# Patient Record
Sex: Female | Born: 1935 | Race: White | Hispanic: No | Smoking: Former smoker
Health system: Southern US, Community
[De-identification: ages and names within clinical notes are randomized; demographics above are authoritative.]

## PROBLEM LIST (undated history)

## (undated) DIAGNOSIS — E039 Hypothyroidism, unspecified: Secondary | ICD-10-CM

## (undated) DIAGNOSIS — K219 Gastro-esophageal reflux disease without esophagitis: Secondary | ICD-10-CM

## (undated) DIAGNOSIS — R55 Syncope and collapse: Secondary | ICD-10-CM

## (undated) DIAGNOSIS — M069 Rheumatoid arthritis, unspecified: Secondary | ICD-10-CM

## (undated) DIAGNOSIS — G901 Familial dysautonomia [Riley-Day]: Secondary | ICD-10-CM

## (undated) HISTORY — DX: Hypothyroidism, unspecified: E03.9

## (undated) HISTORY — PX: PACEMAKER PLACEMENT: SHX43

## (undated) HISTORY — PX: CHOLECYSTECTOMY: SHX55

## (undated) HISTORY — DX: Familial dysautonomia (riley-day): G90.1

## (undated) HISTORY — DX: Gastro-esophageal reflux disease without esophagitis: K21.9

## (undated) HISTORY — DX: Rheumatoid arthritis, unspecified: M06.9

## (undated) HISTORY — PX: OTHER SURGICAL HISTORY: SHX169

## (undated) HISTORY — DX: Syncope and collapse: R55

---

## 2005-04-03 ENCOUNTER — Ambulatory Visit: Payer: Self-pay

## 2005-05-07 ENCOUNTER — Ambulatory Visit: Payer: Self-pay

## 2005-05-07 ENCOUNTER — Encounter: Payer: Self-pay | Admitting: Cardiology

## 2005-05-07 ENCOUNTER — Ambulatory Visit: Payer: Self-pay | Admitting: Internal Medicine

## 2005-09-11 ENCOUNTER — Ambulatory Visit: Payer: Self-pay | Admitting: Internal Medicine

## 2005-09-27 ENCOUNTER — Ambulatory Visit: Payer: Self-pay | Admitting: Internal Medicine

## 2005-09-27 ENCOUNTER — Ambulatory Visit (HOSPITAL_COMMUNITY): Admission: RE | Admit: 2005-09-27 | Discharge: 2005-09-27 | Payer: Self-pay | Admitting: Internal Medicine

## 2005-10-09 ENCOUNTER — Ambulatory Visit: Payer: Self-pay

## 2005-10-11 ENCOUNTER — Ambulatory Visit: Payer: Self-pay | Admitting: Cardiovascular Disease

## 2005-12-17 ENCOUNTER — Ambulatory Visit: Payer: Self-pay | Admitting: Internal Medicine

## 2006-01-30 ENCOUNTER — Ambulatory Visit: Payer: Self-pay | Admitting: Internal Medicine

## 2006-04-07 ENCOUNTER — Ambulatory Visit: Payer: Self-pay | Admitting: Internal Medicine

## 2006-04-18 ENCOUNTER — Ambulatory Visit: Payer: Self-pay | Admitting: Internal Medicine

## 2006-08-19 ENCOUNTER — Ambulatory Visit: Payer: Self-pay | Admitting: Internal Medicine

## 2006-08-19 LAB — CONVERTED CEMR LAB
Basophils Relative: 0.9 % (ref 0.0–1.0)
CO2: 31 meq/L (ref 19–32)
Calcium: 9.1 mg/dL (ref 8.4–10.5)
Creatinine, Ser: 0.8 mg/dL (ref 0.4–1.2)
Eosinophils Relative: 1.4 % (ref 0.0–5.0)
GFR calc Af Amer: 89 mL/min
Glucose, Bld: 106 mg/dL — ABNORMAL HIGH (ref 70–99)
Hemoglobin: 13.2 g/dL (ref 12.0–15.0)
Lymphocytes Relative: 26.9 % (ref 12.0–46.0)
Monocytes Absolute: 0.5 10*3/uL (ref 0.2–0.7)
Neutro Abs: 5.4 10*3/uL (ref 1.4–7.7)
Potassium: 4.4 meq/L (ref 3.5–5.1)
Prothrombin Time: 11.4 s (ref 10.0–14.0)
WBC: 8.4 10*3/uL (ref 4.5–10.5)

## 2006-08-22 ENCOUNTER — Ambulatory Visit (HOSPITAL_COMMUNITY): Admission: RE | Admit: 2006-08-22 | Discharge: 2006-08-23 | Payer: Self-pay | Admitting: Internal Medicine

## 2006-08-22 ENCOUNTER — Ambulatory Visit: Payer: Self-pay | Admitting: Internal Medicine

## 2006-09-10 ENCOUNTER — Ambulatory Visit: Payer: Self-pay

## 2006-10-06 ENCOUNTER — Ambulatory Visit: Payer: Self-pay

## 2006-12-24 ENCOUNTER — Ambulatory Visit: Payer: Self-pay | Admitting: Internal Medicine

## 2007-05-27 ENCOUNTER — Ambulatory Visit: Payer: Self-pay | Admitting: Cardiology

## 2007-05-29 ENCOUNTER — Ambulatory Visit: Payer: Self-pay

## 2007-06-10 ENCOUNTER — Ambulatory Visit: Payer: Self-pay

## 2007-09-15 ENCOUNTER — Ambulatory Visit: Payer: Self-pay | Admitting: Internal Medicine

## 2007-10-13 DIAGNOSIS — Z950 Presence of cardiac pacemaker: Secondary | ICD-10-CM | POA: Insufficient documentation

## 2007-10-13 DIAGNOSIS — R55 Syncope and collapse: Secondary | ICD-10-CM | POA: Insufficient documentation

## 2007-12-16 ENCOUNTER — Ambulatory Visit: Payer: Self-pay | Admitting: Internal Medicine

## 2008-03-16 ENCOUNTER — Ambulatory Visit: Payer: Self-pay | Admitting: Internal Medicine

## 2008-04-15 ENCOUNTER — Encounter (INDEPENDENT_AMBULATORY_CARE_PROVIDER_SITE_OTHER): Payer: Self-pay | Admitting: Radiology

## 2008-06-28 ENCOUNTER — Ambulatory Visit: Payer: Self-pay | Admitting: Internal Medicine

## 2008-09-27 ENCOUNTER — Encounter: Payer: Self-pay | Admitting: Internal Medicine

## 2008-09-28 ENCOUNTER — Ambulatory Visit: Payer: Self-pay | Admitting: Internal Medicine

## 2008-10-05 ENCOUNTER — Encounter: Payer: Self-pay | Admitting: Internal Medicine

## 2008-12-28 ENCOUNTER — Ambulatory Visit: Payer: Self-pay | Admitting: Internal Medicine

## 2009-01-10 ENCOUNTER — Encounter: Payer: Self-pay | Admitting: Internal Medicine

## 2009-03-29 ENCOUNTER — Ambulatory Visit: Payer: Self-pay | Admitting: Internal Medicine

## 2009-04-04 ENCOUNTER — Encounter: Payer: Self-pay | Admitting: Internal Medicine

## 2009-07-04 ENCOUNTER — Ambulatory Visit: Payer: Self-pay | Admitting: Internal Medicine

## 2009-07-04 DIAGNOSIS — R42 Dizziness and giddiness: Secondary | ICD-10-CM

## 2009-10-05 ENCOUNTER — Ambulatory Visit: Payer: Self-pay | Admitting: Internal Medicine

## 2009-11-03 ENCOUNTER — Encounter (INDEPENDENT_AMBULATORY_CARE_PROVIDER_SITE_OTHER): Payer: Self-pay | Admitting: *Deleted

## 2009-11-22 ENCOUNTER — Encounter: Payer: Self-pay | Admitting: Internal Medicine

## 2009-12-25 ENCOUNTER — Telehealth: Payer: Self-pay | Admitting: Internal Medicine

## 2010-01-11 ENCOUNTER — Encounter: Payer: Self-pay | Admitting: Internal Medicine

## 2010-01-11 ENCOUNTER — Ambulatory Visit
Admission: RE | Admit: 2010-01-11 | Discharge: 2010-01-11 | Payer: Self-pay | Source: Home / Self Care | Attending: Internal Medicine | Admitting: Internal Medicine

## 2010-02-06 NOTE — Cardiovascular Report (Signed)
Summary: Office Visit Remote   Office Visit Remote   Imported By: Roderic Ovens 11/24/2009 14:06:26  _____________________________________________________________________  External Attachment:    Type:   Image     Comment:   External Document

## 2010-02-06 NOTE — Letter (Signed)
Summary: Remote Device Check  [facility], Main Office  [address]   [place], [place] [address]   Phone: [phone]  Fax: [phone]     November 22, 2009 MRN: [id number]   STEPFANIE YOTT [address] [place], [place]  [address]   Dear Ms. Washinton,   Your remote transmission was recieved and reviewed by your physician.  All diagnostics were within normal limits for you.  __X___Your next transmission is scheduled for:   01-11-2010.  Please transmit at any time this day.  If you have a wireless device your transmission will be sent automatically.   Sincerely,  Vella Kohler

## 2010-02-06 NOTE — Assessment & Plan Note (Signed)
Summary: device/medtronic/saf   CC:  medtronic.  Marland Kitchen  History of Present Illness: Rita [name] comes in with her daughter in followup for syncope in the context of intermittent complete heart block which on further evaluation may have been autonomic inferred on the basis of the lack of peaking on her device    She has had no recurrent syncope. She does have orthostatic lightheadedness. She does not know that she gets dizzy getting out of a shower or out of the tub   Current Medications (verified): 1)  Vitamin E 600 Unit  Caps (Vitamin E) .... Take One Tablet By Mouth Once Daily. 2)  Vitamin B-12 500 Mcg  Tabs (Cyanocobalamin) .... Take One Tablet By Mouth Once Daily. 3)  Fish Oil   Oil (Fish Oil) .... Take One Capsule Once Daily 4)  Multivitamins   Tabs (Multiple Vitamin) 5)  Prednisone 5 Mg Tabs (Prednisone) .... 2 Tabs Once Daily 6)  Methotrexate 2.5 Mg Tabs (Methotrexate Sodium) .Marland Kitchen.. 10 Tablets Every Tuesday 7)  Prednisone 10 Mg Tabs (Prednisone) .... Take 1/2 Tablet Every Day 8)  Osteo Bi-Flex Adv Double St  Caps (Misc Natural Products) .... Uad  Allergies (verified): 1)  ! Sulfa  Past History:  Past Surgical History: Last updated: 10/13/2007 pacemaker loop recorder Cholecystectomy  Social History: Last updated: 10/13/2007 Widowed  3 children Tobacco Use - No.  Alcohol Use - no Drug Use - no  Past Medical History: Syncope Dysautonomia Pacemaker-AV (S/P) Hypothyroidism G E R D RA  Vital Signs:  Patient profile:   77 year old female Height:      63 inches Weight:      168 pounds BMI:     29.87 Pulse rate:   99 / minute Pulse rhythm:   regular BP sitting:   108 / 68  (left arm) Cuff size:   large  Vitals Entered By: Judithe Modest CMA (July 04, 2009 9:23 AM)  Physical Exam  General:  The patient was alert and oriented in no acute distress. HEENT Normal.  Neck veins were flat, carotids were brisk.  Lungs were clear.  Heart sounds were regular without  murmurs or gallops.  Abdomen was soft with active bowel sounds. There is no clubbing cyanosis or edema. Skin Warm and dry    PPM Specifications Following MD:  Sherryl Manges, MD     PPM Vendor:  Medtronic     PPM Model Number:  [id number]     PPM Serial Number:  [id number] PPM DOI:  08/22/2006     PPM Implanting MD:  Sherryl Manges, MD  Lead 1    Location: RA     DOI: 08/22/2006     Model #: [id number]     Serial #: [id number]     Status: active Lead 2    Location: RV     DOI: 08/22/2006     Model #: [id number]     Serial #: [id number]     Status: active   Indications:  SYNCOPE,CHB   PPM Follow Up Battery Voltage:  2.77 V     Battery Est. Longevity:  23yrs     Pacer Dependent:  No       PPM Device Measurements Atrium  Amplitude: 4.00 mV, Impedance: 492 ohms, Threshold: 0.750 V at 0.40 msec Right Ventricle  Amplitude: 15.68 mV, Impedance: 705 ohms, Threshold: 0.50 V at 0.40 msec  Episodes MS Episodes:  2     Percent Mode Switch:  <0.1%     Ventricular High  Rate:  4     Atrial Pacing:  <0.1%     Ventricular Pacing:  0.2%  Parameters Mode:  MVP     Lower Rate Limit:  60     Upper Rate Limit:  130 Paced AV Delay:  190     Sensed AV Delay:  160 Next Remote Date:  10/05/2009     Tech Comments:  43 RATE DROP EPISODES.  4 VHR EPISODES AND 2 AHR EPISODES--SVT 1:1.  CHANGED RA OUTPUT FROM 1.5 TO 2.0 AND RV OUTPUT FROM 2.0 TO 2.5.  CARELINK CHECK 10-05-09. Vella Kohler  July 04, 2009 9:37 AM  Impression & Recommendations:  Problem # 1:  PACEMAKER-DDD  MDT (ICD-V45.Marland Kitchen01) Device parameters and data were reviewed and no changes were made  Problem # 2:  ORTHOSTATIC DIZZINESS (ICD-780.4) orthostatic lightheadedness with moderate evidence of orthostatic hypotension with an increase of her heart rate from 90-106 and decrease in her blood pressure 100-93. She has moderate hypotension. We'll check her cortisol level and advise a variety of anti-orthostatic behaviors including raising the head of the bed and  isometric exercise.  discussion with the patient and her daughter suggested like to try medication also given frequency of the symptoms. We'll start ProAmatine 2.5  3 times a day  Problem # 3:  AV BLOCK, COMPLETE LIKELY DYSAUTONOMIC (ICD-426.0) no intercurrent episode  Patient Instructions: 1)   You are scheduled for a device check from home on October 05, 2009. You may send your transmission at any time that day. If you have a wireless device, the transmission will be sent automatically. After your physician reviews your transmission, you will receive a postcard with your next transmission date. 2)  Your physician wants you to follow-up in: 12 months with Dr Graciela Husbands.  You will receive a reminder letter in the mail two months in advance. If you don't receive a letter, please call our office to schedule the follow-up appointment. 3)  Your physician recommends that you return for lab work in: 2 months (cortisol level) 4)  Your physician has recommended you make the following change in your medication: Start Midodrine 2.5mg  1 tablet three times a day at 7AM, 11Am, 3PM Prescriptions: MIDODRINE HCL 2.5 MG TABS (MIDODRINE HCL) Take 1 tablet by mouth three times daily at 7AM, 11AM, and 3PM  #90 x 3   Entered by:   Optometrist BSN   Authorized by:   Nathen May, MD, [facility]   Signed by:   Gypsy Balsam RN BSN on 07/04/2009   Method used:   Electronically to        [facility]. [phone]* (retail)       [address]       [place], [place]  [address]       Ph: [phone] or [phone]       Fax: [phone]   RxID:   [id number]

## 2010-02-06 NOTE — Cardiovascular Report (Signed)
Summary: Office Visit Remote   Office Visit Remote   Imported By: Roderic Ovens 01/12/2009 12:40:33  _____________________________________________________________________  External Attachment:    Type:   Image     Comment:   External Document

## 2010-02-06 NOTE — Cardiovascular Report (Signed)
Summary: Office Visit Remote   Office Visit Remote   Imported By: Roderic Ovens 04/06/2009 12:00:45  _____________________________________________________________________  External Attachment:    Type:   Image     Comment:   External Document

## 2010-02-06 NOTE — Miscellaneous (Signed)
Summary: dx correction  Clinical Lists Changes  Problems: Changed problem from PACEMAKER DDD MDT (ICD-V45..01) to PACEMAKER, PERMANENT (ICD-V45.01)  changed the incorrect dx code to correct dx code 

## 2010-02-06 NOTE — Cardiovascular Report (Signed)
Summary: Office Visit   Office Visit   Imported By: Roderic Ovens 07/05/2009 12:29:00  _____________________________________________________________________  External Attachment:    Type:   Image     Comment:   External Document

## 2010-02-06 NOTE — Letter (Signed)
Summary: Remote Device Check  [facility], Main Office  [address]   [place], [place] [address]   Phone: [phone]  Fax: [phone]     January 10, 2009 MRN: [id number]   Rita [name] [address] [place], [place]  [address]   Dear Ms. Solano,   Your remote transmission was recieved and reviewed by your physician.  All diagnostics were within normal limits for you.  __X___Your next transmission is scheduled for:   March 29, 2009.  Please transmit at any time this day.  If you have a wireless device your transmission will be sent automatically.     Sincerely,  Proofreader

## 2010-02-06 NOTE — Letter (Signed)
Summary: Remote Device Check  [facility], Main Office  [address]   [place], [place] [address]   Phone: [phone]  Fax: [phone]     April 04, 2009 MRN: [id number]   MIKALIA FESSEL [address] [place], [place]  [address]   Dear Ms. Reiling,   Your remote transmission was recieved and reviewed by your physician.  All diagnostics were within normal limits for you.    ___X___Your next office visit is scheduled for:    JUNE 2011 WITH DR Graciela Husbands. Please call our office to schedule an appointment.    Sincerely,  Proofreader

## 2010-02-08 NOTE — Progress Notes (Signed)
Summary: pt having sob, weakness, and blurred vision  Phone Note Call from Patient   Caller: Daughter beverly garner [phone] Reason for Call: Talk to Nurse Summary of Call: pt having sob, blurred vision, and weakness for about a week-pls call to advise Initial call taken by: Glynda Jaeger,  December 25, 2009 8:17 AM  Follow-up for Phone Call        per pt daughter pt feels like she will pass out when getting up, yesterday she barely got out of bed, [facility] and feels faint, vision is blurred, when breathes she holds chest and has a cough-no sputum, very weak. adv daughter will discuss with dod.  Follow-up by: Claris Gladden RN,  December 25, 2009 8:54 AM  Additional Follow-up for Phone Call Additional follow up Details #1::        spoke w/PA and discussed symptoms and he recommed pt be seen in ED. Adv pt daughter and pt afraid she will end up in hospital. Adv daughter that pt really needs checked and that PA concerned about PNA. She will also have  a transmission sent today. Daughter has not checked pt temp. Additional Follow-up by: Claris Gladden RN,  December 25, 2009 9:12 AM

## 2010-02-18 ENCOUNTER — Encounter (INDEPENDENT_AMBULATORY_CARE_PROVIDER_SITE_OTHER): Payer: Self-pay | Admitting: *Deleted

## 2010-02-28 NOTE — Cardiovascular Report (Signed)
Summary: Office Visit Remote   Office Visit Remote   Imported By: Roderic Ovens 02/20/2010 15:19:04  _____________________________________________________________________  External Attachment:    Type:   Image     Comment:   External Document

## 2010-02-28 NOTE — Letter (Signed)
Summary: Remote Device Check  [facility], Main Office  [address]   [place], [place] [address]   Phone: [phone]  Fax: [phone]     February 18, 2010 MRN: [id number]   Rita [name] [address] [place], [place]  [address]   Dear Ms. Stuckey,   Your remote transmission was recieved and reviewed by your physician.  All diagnostics were within normal limits for you.  __X___Your next transmission is scheduled for:  04-12-2010.  Please transmit at any time this day.  If you have a wireless device your transmission will be sent automatically.   Sincerely,  Vella Kohler

## 2010-03-20 ENCOUNTER — Encounter: Payer: Self-pay | Admitting: Emergency Medicine

## 2010-03-20 ENCOUNTER — Encounter (INDEPENDENT_AMBULATORY_CARE_PROVIDER_SITE_OTHER): Payer: Medicare Other

## 2010-03-20 ENCOUNTER — Institutional Professional Consult (permissible substitution) (INDEPENDENT_AMBULATORY_CARE_PROVIDER_SITE_OTHER): Payer: Medicare Other | Admitting: Emergency Medicine

## 2010-03-20 DIAGNOSIS — R0602 Shortness of breath: Secondary | ICD-10-CM

## 2010-03-20 DIAGNOSIS — R599 Enlarged lymph nodes, unspecified: Secondary | ICD-10-CM | POA: Insufficient documentation

## 2010-03-20 LAB — PULMONARY FUNCTION TEST

## 2010-03-27 NOTE — Assessment & Plan Note (Signed)
Summary: pft charges   Allergies: 1)  ! Sulfa   Other Orders: Carbon Monoxide diffusing w/capacity (29562) Lung Volumes/Gas dilution or washout (13086) Spirometry (Pre & Post) [phone])

## 2010-04-05 NOTE — Assessment & Plan Note (Signed)
Summary: dyspnea, hilar LAD   Visit Type:  Initial Consult Copy to:  Dr. Luna Kitchens Primary Provider/Referring Provider:  Dr. Luna Kitchens  CC:  Pulmonary consult.  Increased SOB w/ exertion and at rest..  History of Present Illness: 77 yo former light smoker (1.5pk-yrs), followed by Dr Welton Flakes. Hx of Heart Block s/p pacer placement, symptomatic hypotension formerly on midodrine. Has been seen in [facility] ED at [facility] on several occasions for acute dyspnea, ? panic attacks. She began to have these problems Fall 2011. She complains now of difficulty getting a deep breath - she does sighing breaths in the office today. No wheeze, no cough, no CP. The sighing breaths are her largest complaint, and have been the most striking abnormality seen by family. Her exertional tolerance may be decreased as well.   Eval:  ABG (02/04/10) 7.47/ 27/ 104/ 20 on RA CT scan chest (02/04/10 [name]) - No PE, no parenchymal dz, borderline R hilar and pretracheal nodes, 1.7cm subcarinal node.   Preventive Screening-Counseling & Management  Alcohol-Tobacco     Alcohol drinks/day: 0     Smoking Status: quit     Packs/Day: 0.5     Year Started: 1957     Year Quit: 1960  Current Medications (verified): 1)  Methotrexate 2.5 Mg Tabs (Methotrexate Sodium) .Marland Kitchen.. 10 Tablets Every Tuesday 2)  Lorazepam 1 Mg Tabs (Lorazepam) .... 1/2 By Mouth Daily 3)  Ondansetron Hcl 8 Mg Tabs (Ondansetron Hcl) .Marland Kitchen.. 1 By Mouth Two Times A Day As Needed 4)  Folic Acid 1 Mg Tabs (Folic Acid) .Marland Kitchen.. 1 By Mouth Daily 5)  Zolpidem Tartrate 5 Mg Tabs (Zolpidem Tartrate) .Marland Kitchen.. 1 By Mouth At Bedtime 6)  Famotidine 40 Mg Tabs (Famotidine) .Marland Kitchen.. 1 By Mouth Daily 7)  Citalopram Hydrobromide 10 Mg Tabs (Citalopram Hydrobromide) .Marland Kitchen.. 1 By Mouth Daily 8)  Hydroxychloroquine Sulfate 200 Mg Tabs (Hydroxychloroquine Sulfate) .... 2 By Mouth Daily  Allergies (verified): 1)  ! Sulfa  Past History:  Past Medical History: Syncope Dysautonomia Pacemaker-AV  (S/P) Hypothyroidism G E R D RA, on MTX since 2010.   Past Surgical History: Reviewed history from 10/13/2007 and no changes required. pacemaker loop recorder Cholecystectomy  Family History: Family History Colon Cancer --- mother  Social History: Widowed  3 children Tobacco Use - No.  Alcohol Use - no Drug Use - no Patient states former smoker.  Has lived in [place], [place], [place].  No known TB exposures. Alcohol drinks/day:  0 Smoking Status:  quit Packs/Day:  0.5  Review of Systems       The patient complains of shortness of breath with activity, shortness of breath at rest, loss of appetite, weight change, difficulty swallowing, anxiety, and joint stiffness or pain.  The patient denies productive cough, non-productive cough, coughing up blood, chest pain, irregular heartbeats, acid heartburn, indigestion, abdominal pain, sore throat, tooth/dental problems, headaches, nasal congestion/difficulty breathing through nose, sneezing, itching, ear ache, depression, hand/feet swelling, rash, change in color of mucus, and fever.    Vital Signs:  Patient profile:   77 year old female Height:      63 inches (160.02 cm) Weight:      132 pounds (60.00 kg) BMI:     23.47 O2 Sat:      100 % on Room air Temp:     97.4 degrees F (36.33 degrees C) oral Pulse rate:   74 / minute BP sitting:   110 / 64  (right arm) Cuff size:   regular  Vitals Entered By: Michel Bickers CMA (March 20, 2010 10:54 AM)  O2 Sat at Rest %:  100 O2 Flow:  Room air  Serial Vital Signs/Assessments:  Comments: Ambulatory Pulse Oximetry  Resting; HR__71___    02 Sat_99%ra____  Lap1 (185 feet)   HR_82____   02 Sat__97%ra___ Lap2 (185 feet)   HR_85____   02 Sat__96%ra___    Lap3 (185 feet)   HR_84____   02 Sat__93%ra___  _X__Test Completed without Difficulty ___Test Stopped due to:  Carver Fila  March 20, 2010 12:00 PM  By: Carver Fila   CC: Pulmonary consult.  Increased SOB w/ exertion and at rest. Is  Patient Diabetic? No Comments Medications reviewed with patient Michel Bickers CMA  March 20, 2010 11:09 AM   Physical Exam  General:  elderly woman, poor memory Head:  normocephalic and atraumatic Eyes:  conjunctiva and sclera clear Nose:  no deformity, discharge, inflammation, or lesions Mouth:  no deformity or lesions, poor dentition Lungs:  clear bilaterally to auscultation and percussion Heart:  regular rate and rhythm, S1, S2 without murmurs, rubs, gallops, or clicks Abdomen:  not examined Extremities:  no clubbing, cyanosis, edema, or deformity noted Neurologic:  non-focal Skin:  intact without lesions or rashes Psych:  easily distracted, poor concentration, poor memory, and oriented.     Pulmonary Function Test Date: 03/20/2010 Height (in.): 63 Gender: Female  Pre-Spirometry FVC    Value: 3.35 L/min   Pred: 2.45 L/min     % Pred: 137 % FEV1    Value: 2.54 L     Pred: 1.65 L     % Pred: 154 % FEV1/FVC  Value: 76 %     Pred: 70 %    FEF 25-75  Value: 2.23 L/min   Pred: 1.90 L/min     % Pred: 118 %  Post-Spirometry FVC    Value: 3.36 L/min   Pred: 2.45 L/min     % Pred: 137 % FEV1    Value: 2.87 L     Pred: 1.65 L     % Pred: 162 % FEV1/FVC  Value: 79 %     Pred: 70 %    FEF 25-75  Value: 2.60 L/min   Pred: 1.90 L/min     % Pred: 137 %  Lung Volumes TLC    Value: 5.42 L   % Pred: 120 % RV    Value: 2.03 L   % Pred: 104 % DLCO    Value: 11.6 %   % Pred: 72 % DLCO/VA  Value: 2.95 %   % Pred: 90 %  Comments: Normal airflows but evidence on spiro and volumes for hyperinflation. DLCO corrects for alveolar volume. RSB  Impression & Recommendations:  Problem # 1:  DYSPNEA (ICD-786.05) Etiology unclear. Her biggest complaint sounds restrictive, unable to take a deep breath, could be related to hyperinflation on PFT. No AFL to explain this finding.  - walking oximetry - no meds at this time, consider trial SABA in the future  Problem # 2:  MEDIASTINAL LYMPHADENOPATHY  (ICD-785.6) - Repeat Ct scan chest in 1 yr  Medications Added to Medication List This Visit: 1)  Lorazepam 1 Mg Tabs (Lorazepam) .... 1/2 by mouth daily 2)  Ondansetron Hcl 8 Mg Tabs (Ondansetron hcl) .Marland Kitchen.. 1 by mouth two times a day as needed 3)  Folic Acid 1 Mg Tabs (Folic acid) .Marland Kitchen.. 1 by mouth daily 4)  Zolpidem Tartrate 5 Mg Tabs (Zolpidem tartrate) .Marland Kitchen.. 1 by mouth at  bedtime 5)  Famotidine 40 Mg Tabs (Famotidine) .Marland Kitchen.. 1 by mouth daily 6)  Citalopram Hydrobromide 10 Mg Tabs (Citalopram hydrobromide) .Marland Kitchen.. 1 by mouth daily 7)  Hydroxychloroquine Sulfate 200 Mg Tabs (Hydroxychloroquine sulfate) .... 2 by mouth daily  Other Orders: Consultation Level IV (40981)  Patient Instructions: 1)  Walking oximetry today.  2)  Schedule full pulmonary function testing  3)  We will repeat your CT scan of the chest in 12 months 4)  Follow up with Dr Delton Coombes in 1 month or as needed

## 2010-04-11 ENCOUNTER — Encounter: Payer: Self-pay | Admitting: Emergency Medicine

## 2010-04-12 ENCOUNTER — Encounter: Payer: Self-pay | Admitting: *Deleted

## 2010-04-17 ENCOUNTER — Encounter: Payer: Self-pay | Admitting: *Deleted

## 2010-04-20 ENCOUNTER — Ambulatory Visit: Payer: Medicare Other | Admitting: Emergency Medicine

## 2010-04-20 ENCOUNTER — Other Ambulatory Visit: Payer: Self-pay

## 2010-04-20 ENCOUNTER — Ambulatory Visit (INDEPENDENT_AMBULATORY_CARE_PROVIDER_SITE_OTHER): Payer: Medicare Other | Admitting: *Deleted

## 2010-04-20 DIAGNOSIS — I495 Sick sinus syndrome: Secondary | ICD-10-CM

## 2010-04-26 NOTE — Progress Notes (Signed)
Pacer remote 

## 2010-05-08 ENCOUNTER — Encounter: Payer: Self-pay | Admitting: *Deleted

## 2010-05-22 NOTE — Assessment & Plan Note (Signed)
[facility]                            CARDIOLOGY OFFICE NOTE   Rita [name], Rita [name]                       MRN:          [id number]  DATE:05/27/2007                            DOB:          Jul 17, 1926    Ms. Rita [name] is a very pleasant 77 year old female patient of Dr. Brett Canales  Klein's with a past medical history of complete heart block, status post  pacemaker placement, hypothyroidism, anxiety, who presents for  evaluation of chest pain.  Note, the patient did have a Myoview  performed in [place] in August of 2005.  At that time she had normal  left ventricular function.  There is no evidence of ischemia or  infarction.  Her last echocardiogram in May of 2007 showed normal left  ventricular function, trivial mitral regurgitation, and tricuspid  regurgitation.  There was also an atrial septal aneurysm.  She is status  post pacemaker placement in August of 2008 secondary to complete heart  block and syncope.  Note, the patient typically does not have dyspnea on  exertion, orthopnea, PND, pedal edema, palpitations, presyncope,  syncope, or exertional chest pain.  Yesterday at approximately 11 a.m.  she developed a pain in her chest that radiated to her back, up to her  neck, and down her left upper extremity.  It was described as a sharp  pain.  There was no associated nausea, vomiting, shortness of breath, or  diaphoresis.  It was not pleuritic or positional, nor is it related to  food.  It was not exertional.  It lasted for approximately 30 minutes  and resolved spontaneously.  Note, she has not had chest pain since  then, but asked to be seen for this particular issues.  She also states  that she has had some pains in her head transiently.  She also states  that she has felt like she is not in the right body.  She has been  somewhat disoriented.  There has been no weakness or loss of sensation  in her extremities.  There have been no other neurological  symptoms,  although she has had some transient lightheadedness.   Her medications include a multivitamin daily.  She has been on Synthroid  and trazodone, but only takes those occasionally.   She has an allergy to SULFA.   SOCIAL HISTORY:  She has a remote history of tobacco use, but has not  smoked in 40 to 50 years.  She does not consume alcohol.   FAMILY HISTORY:  Her family history is negative for coronary artery  disease.   PAST MEDICAL HISTORY:  There is no diabetes mellitus, hypertension, or  hyperlipidemia by her report.  She does have a history of pacemaker  placement secondary to complete heart block as described in the HPI.  She apparently has a history of anxiety/depression as well as chronic  insomnia.   REVIEW OF SYSTEMS:  She denies any headaches, fevers, or chills.  There  is no productive cough or hemoptysis.  There is no dysphagia,  odynophagia, melena or hematochezia.  There is no dysuria or hematuria.  There are  no rashes or seizure activity.  There is no orthopnea, PND, or  pedal edema.  The remaining systems are negative.   PHYSICAL EXAMINATION:  Her physical examination today shows a blood  pressure in the left arm of 108/65 and in the right arm of 121/71.  Her  pulse is 80.  She is well developed and well nourished, no acute distress.  Her skin is warm and dry.  She does not appear to be depressed, although mildly anxious.  There is no peripheral clubbing.  Her back is normal.  Her HEENT is normal, normal eyelids.  Her neck is supple with a normal upstroke bilaterally.  There are no  bruits noted.  There is no jugular venous distension and I cannot appreciate  thyromegaly.  Her chest is clear to auscultation with normal expansion.  CARDIOVASCULAR EXAM:  Reveals a regular rate and rhythm, normal S1 and  S2.  There are no murmurs, rubs, or gallops noted.  ABDOMINAL EXAM:  Nontender, nondistended, positive bowel sounds, no  hepatosplenomegaly, no mass  appreciated.  There is no abdominal bruit.  Note, she has 2+ radial pulses bilaterally.  She has 2+ femoral pulses  bilaterally, no bruits.  Extremities show no edema, I could palpate no cords.  She has 2+  dorsalis pedis pulses bilaterally.  NEUROLOGIC EXAM:  Grossly intact.  Note, cranial nerves II-XII are  intact.  Her pupils are equally round and reactive to light.  Her motor  strength is 5/5 in all extremities. Her nerve sensation is normal to  light touch.  She has 2+ reflexes in her lower extremities and 1+ in her  upper extremities.   Her electrocardiogram shows a sinus rhythm at a rate of 80.  There are  no ST changes noted.   DIAGNOSES:  1. Chest pain:  The etiology of this is unclear to me.  Her      electrocardiogram is normal and her symptoms occurred approximately      26 hours ago for approximately 30 minutes.  She has had no symptoms      since then.  I will schedule her to have a troponin and a CK-MB      now.  If it is normal, then she has ruled out for myocardial      infarction.  We will then schedule her for an adenosine Myoview for      risk stratification and she will follow up with Dr. Graciela Husbands      concerning this issue.  If it is abnormal then she will need to be      admitted and undergo cardiac catheterization.  2. Elevated blood pressure:  Her blood pressure apparently was      elevated at home and it was 150/72 when she arrived today.      However, on followup it was normal.  We will not pursue this      further at this point.  3. History of hypothyroidism:  She is not taking her Synthroid, and I      have encouraged her to followup with her primary care physician      concerning this issue.   Note, we will have her follow up with Dr. Graciela Husbands for her pacemaker check  and for review of her Myoview in the near future.     Madolyn Frieze Jens Som, MD, [facility]  Electronically Signed    BSC/MedQ  DD: 05/27/2007  DT: 05/27/2007  Job #: [id number]

## 2010-05-22 NOTE — Assessment & Plan Note (Signed)
[facility]                         ELECTROPHYSIOLOGY OFFICE NOTE   Rita [name], Rita [name]                       MRN:          [id number]  DATE:08/19/2006                            DOB:          1926-10-08    [name] came into the office yesterday because of another syncopal  episode.  Interrogation of her loop recorder demonstrated an episode of  complete heart block that correlated in time with her event.   She has, however, had significant residual dizziness.  Apparently she  has longstanding lightheadedness and instability.  They problems with  dizziness, however, have been more pronounced since the episode on  Friday and indeed had another presyncopal episode on Sunday that was  unassociated with any documented arrhythmia.   On examination, her vital signs were stable.  Lungs were clear, heart  sounds were regular.  The abdomen was soft with active bowel sounds.  The extremities were without edema.  Pulses were intact.  There was no  clubbing or cyanosis.  Her neurologic exam was notable for no nystagmus,  either horizontally or vertically; a positive Romberg sign.   Interrogation of her loop recorder is as noted previously.   IMPRESSION:  1. Syncope.  2. Documented intermittent complete heart block.  3. Residual dizziness.   We will plan to explant her loop recorder and implant a pacemaker.  Because of her dizziness, which seems to be above and beyond the  syncopal episodes, we will plan to get an MRI prior to pacemaker  implantation so that data will be available for the neurologist.  I have  reviewed with the patient and her daughter the potential benefits as  well as potential risks of the procedure.  They agree and are willing to  proceed.     [name], MD, [facility]  Electronically Signed    SCK/MedQ  DD: 08/20/2006  DT: 08/20/2006  Job #: [id number]

## 2010-05-22 NOTE — Assessment & Plan Note (Signed)
[facility]                         ELECTROPHYSIOLOGY OFFICE NOTE   CHARLEEN, MADERA                       MRN:          [id number]  DATE:08/19/2006                            DOB:          01/11/1926    Mrs. Rita [name] comes in with an episode of near-syncope last Friday.  Her  device was interrogated today and it demonstrates intermittent complete  heart block with numerous seconds of nothing but P waves.   What is bothersome is that she continues to have residual dizziness and  had a severe episode of dizziness on Sunday, no arrhythmia for which was  recorded.   EXAMINATION:  Her blood pressure today was 135/75, her pulse was 76.  LUNGS:  Clear.  HEART SOUNDS:  Regular.  EXTREMITIES:  Without edema.  NEUROLOGIC EXAM:  Notable for no nystagmus and poor balance with eyes  closed, although this is apparently a long-standing problem, according  to her daughter.   Electrocardiogram demonstrated sinus rhythm at 81 with intervals of  0.17/0.08/0.37.   IMPRESSION:  1. Syncope with now-documented intermittent complete heart block.  2. Residual dizziness.  Question cause.  Mrs. Rita [name] has syncope with      now-documented intermittent complete heart block and pacing is      indicated.  I am bothered by the residual dizziness.  We will plan      to undertake an MRI scan prior to pacemaker implantation, also      prior to loop explantation, so that this date is available to the      neurologist, if necessary.   I have reviewed the potential benefits, as well as potential risks of  the procedure, with the family.  They understand, agree and are willing  to proceed.     [name], MD, [facility]  Electronically Signed    SCK/MedQ  DD: 08/19/2006  DT: 08/20/2006  Job #: [id number]

## 2010-05-22 NOTE — Assessment & Plan Note (Signed)
[facility]                         ELECTROPHYSIOLOGY OFFICE NOTE   ERYANNA, REGAL                       MRN:          [id number]  DATE:09/10/2006                            DOB:          1926/10/03    Rita [name] is seen today in the clinic on September 10, 2006, for a  wound check of her newly implanted Medtronic model number [id number] Adapta.  Date of implant was August 22, 2006.  At the same time, she had a Reveal  loop monitor removed.  On interrogation of her device today, her battery  voltage is 2.78, P-waves measured greater than 2.8 mV with an atrial  capture threshold of 0.75 volts at 0.4 msec and an atrial lead impedance  of 573 ohms.  R-waves measured 11.2 to 22.4 mV with a ventricular  capture threshold of 0.5 volts at 0.46 msec and a ventricular lead  impedance of 750 ohms.  Underlying rhythm today was sinus rhythm at 76  beats per minute.  She is V pacing less than 0.1% of the time.  There  were three rate drop episodes noted.  Her Steri-Strips were removed from  both wounds and they are clear without redness or edema.  She will be  seen again in December.      Altha Harm, LPN  Electronically Signed      [name], MD, [facility]  Electronically Signed   PO/MedQ  DD: 09/10/2006  DT: 09/10/2006  Job #: [id number]

## 2010-05-22 NOTE — Discharge Summary (Signed)
[name], [name]              ACCOUNT NO.:  [id number]   MEDICAL RECORD NO.:  [id number]          PATIENT TYPE:  OIB   LOCATION:  4705                         FACILITY:  [facility]   PHYSICIAN:  [name], MD, FACCDATE OF BIRTH:  10/19/1926   DATE OF ADMISSION:  08/22/2006  DATE OF DISCHARGE:  08/23/2006                               DISCHARGE SUMMARY   DISCHARGE DIAGNOSIS:  1. Status post explantation of a previously placed loop recorder  2. Implantation of a dual-chamber pacemaker (Medtronic Adaptive 8 DDDR      01, Serial Number [id number]).  3. History of syncope with documented intermittent complete heart      block.   HOSPITAL COURSE:  [name] is a 77 year old female evaluated by Dr.  Graciela Husbands status post recent syncopal episode with implantation of loop  recorder. Recent interrogation demonstrated episode of complete heart  block the correlated time with her syncopal event.  The patient with  residual dizziness. Dr. Graciela Husbands felt the patient would benefit from  implantation of a pacemaker. Risks and benefits were discussed with the  patient.  The patient agreed to proceed with the procedure. She  presented to [facility] on August 22, 2006, underwent  operative procedure by Dr. Graciela Husbands as stated above, tolerated procedure  without complications. Postprocedure day #1, Dr. Graciela Husbands in to examine and  assess the patient. Pacer site stable, afebrile, blood pressure 136/73.  The patient being discharged home to follow up outpatient.  She has been  instructed that she may resume her previously used over-the-counter  vitamins and Zantac.  She is to use Tylenol p.r.n. for pain. She has  been given the post pacemaker discharge instructions with her  limitations and when to call the office for any problems.  She has a  wound check pacer clinic September 3 at 10:40 and then followup  appointment with Dr. Graciela Husbands December 17 at 9:00 a.m.   Duration of discharge encounter:  Less  than 30 minutes.      Dorian Pod, ACNP      [name], MD, [facility]  Electronically Signed    MB/MEDQ  D:  08/23/2006  T:  08/24/2006  Job:  [id number]

## 2010-05-22 NOTE — Op Note (Signed)
NAMEJACQULENE, HUNTLEY              ACCOUNT NO.:  [id number]   MEDICAL RECORD NO.:  [id number]          PATIENT TYPE:  OIB   LOCATION:  4705                         FACILITY:  [facility]   PHYSICIAN:  [name], MD, FACCDATE OF BIRTH:  13-Nov-1926   DATE OF PROCEDURE:  08/22/2006  DATE OF DISCHARGE:                               OPERATIVE REPORT   PREOPERATIVE DIAGNOSIS:  Syncope with previously implanted loop recorder  with documented intermittent complete heart block.   POSTOPERATIVE DIAGNOSIS:  Syncope with previously implanted loop  recorder with documented intermittent complete heart block.   PROCEDURE:  Implantation of a dual-chamber pacemaker with central  venogram and explantation of a previously implanted loop recorder.   Following obtaining informed consent, the patient was brought to the  electrophysiology laboratory and placed on the fluoroscopic table in  supine position.  After routine prep and drape of the left upper chest,  lidocaine was infiltrated in prepectoral subclavicular region.  Incision  was made and carried down to a layer of the prepectoral fascia.  Using  electrocautery and sharp dissection, a pocket was formed similarly.  Hemostasis was obtained.   Thereafter, attention was turned to gaining access to extrathoracic left  subclavian vein which was accomplished without difficulty and without  the aspiration of air or puncture of the artery.  Two guidewires were  placed and 0 silk suture was placed in a figure-of-eight fashion and  allowed to hang loosely.   Subsequently, a 7-French tearaway introducer sheath was placed through  which was passed a Medtronic 5076, 52 cm active fixation ventricular  lead, serial number [id number].  I was unable, however, to manipulate  the lead into the right ventricle.  Because of that, I ended up taking a  central venogram to demonstrate the course of the central venous anatomy  and I wonder whether I was not in some  branch like the azygous going  down, the SVC was quite dilated and the azygous may have been also  dilated.  In any case, this having been done, I was able then to place  the lead on the right ventricular septum where the bipolar R wave was 8  with a pacing impedance of 1360 ohms, threshold was 2.2 volts at 0.5  milliseconds.  Current at threshold 1.7 MA.  There was no diaphragmatic  pacing at 10 volts and the current of injury was brisk.  This lead was  secured to the prepectoral fascia.   The atrial lead was then deployed.  This with a Medtronic 5076, 45 cm  active fixation lead, serial number [id number].  It was manipulated to  the right atrial free wall, the right atrial appendage having been  mapped unsuccessfully.  The bipolar P-wave was 2.4 with a pacing  impedance of 800 ohms and threshold of 1.9 volts at 0.5 milliseconds.  Current at threshold 2.9 MA.  Again, there was no diaphragmatic pacing  at 10 volts.   The current of injury was brisk.  This lead was secured to the  prepectoral fascia.  I then attempted to explore the caudal aspect of  the  pocket to see if I could reach the loop recorder from this  direction.  However, I was not able to do so and the pocket was really  at this point quite deep and I was concerned about bleeding, so I  decided to implant the pacemaker.  The leads were then attached to a  Medtronic Adapta ADDDR01 one pulse generator, serial number [id number].  Ventricular pacing and P synchronous pacing with fusion was identified.  The ventricular lead, I should note, was marked with a tie.  The leads  and the pulse generator were ten placed in the pocket and secured to the  prepectoral fascia.  The wound was closed in three layers in normal  fashion.  Wound was washed, dried and a benzoin and Steri-Strip dressing  was applied.  Needle counts, sponge counts and instrument counts were  correct at the end of the procedure according to the staff.  The patient   tolerated this part without apparent complication.   At this point, lidocaine was infiltrated along the line of the previous  incision over the loop.  The incision was made and carried down to the  layer of the device using sharp dissection.  The pocket was opened.  The  loop was explanted.  The retaining sutures were removed.  The pocket was  copiously irrigated with antibiotic containing saline solution.  The  wound was then closed in three layers in normal fashion.  Wound was  washed, dried and a benzoin and Steri-Strip dressing was applied.  Again, needle counts, sponge counts and instrument counts were correct  at the end of procedure according to  the staff.  The patient tolerated  the procedure without apparent complication.      [name], MD, [facility]  Electronically Signed     SCK/MEDQ  D:  08/22/2006  T:  08/23/2006  Job:  [id number]   cc:   EP Lab  Pacemaker Clinic  [facility]

## 2010-05-22 NOTE — Assessment & Plan Note (Signed)
[facility]                         ELECTROPHYSIOLOGY OFFICE NOTE   Rita [name], Rita [name]                       MRN:          [id number]  DATE:12/24/2006                            DOB:          November 10, 1926    Rita [name] comes in with her daughter in followup for syncope in the  context of complete heart block.  She has had no recurrent syncope.   She continues to complain of an unusual sensation in her head.  She sees  Dr. Melbourne Abts for this.  She thinks that is some better since her  pacemaker improved her and it sounds almost like there is a rushing  sound in her head.   Her medications otherwise include trazodone and Synthroid.   EXAMINATION:  Her blood pressure is 104/65 with a pulse of 65.  LUNGS:  Clear.  HEART SOUNDS:  Regular.  EXTREMITIES:  Without edema.   Interrogation of her Medtronic pulse generator demonstrates a P-wave of  2.8 with impedance of 521 and threshold of 0.625 at 0.4 with the R-wave  of 11 and impedance of 848 with a threshold of 0.625.  was 2.77.  She is  atrially and ventricularly paced, though she does have 65 rate drop  episodes.   IMPRESSION:  1. Recurrent syncope with complete heart block, likely autonomic.  2. Status post pacing for the above, without recurrent symptoms.   Rita [name] is stable.  We will plan to have her be seen in the device  clinic in six months' time.     [name], MD, [facility]  Electronically Signed    SCK/MedQ  DD: 12/24/2006  DT: 12/24/2006  Job #: [id number]   cc:   Rita Lacks, MD

## 2010-05-25 ENCOUNTER — Encounter: Payer: Self-pay | Admitting: Emergency Medicine

## 2010-05-25 NOTE — Letter (Signed)
September 11, 2005     Genene Churn. Love, M.D.  [address]  [address]  [place], [place] [address]   RE:  DODIE, PARISI  MRN:  [id number]  /  DOB:  1926-06-15   Dear Rosanne Ashing,   Ms. Ackerley comes back, having had another syncopal episode, now having had  some fractures in her face.  This episode occurred while she was standing,  having walked back from the sleep with her mail.  She awakened without  warning on the floor, having broken some bones in her face.  She is now  interested in proceeding with a loop recorder.  I reviewed this with her and  her daughter.  I should note that her LV function was normal.  She has a  remote history of chest pain but none in the last year or two.   PHYSICAL EXAMINATION:  VITAL SIGNS:  On examination today, her blood  pressure was 126/71.  Pulse was 77.  HEART:  Regular.  EXTREMITIES:  Without edema.   We will plan to proceed with loop recorder implantation.  I have reviewed  the potential benefits as well as the potential risks with her and her  daughter, and they understand, agree, and are willing to proceed.   Thank you very much for allowing [name] to participate in her care.  I will let  you know what the loop recorder shows.   Sincerely,     [name], MD, [facility]   SCK/MedQ  DD:  09/11/2005  DT:  09/11/2005  Job #:  [id number]

## 2010-05-25 NOTE — Assessment & Plan Note (Signed)
[facility]                         ELECTROPHYSIOLOGY OFFICE NOTE   LAKEITA, PANTHER                       MRN:          [id number]  DATE:04/07/2006                            DOB:          1926-10-09    Ms. Schrecengost is seen.  She has a history of syncope.  She is status post  loop recorder implantation.  She has had no intercurrent events.   Her only medication is Lunesta taken p.r.n. but it is not working and  she is not sleeping.  On examination, her blood pressure today was  110/68, her pulse was 64.  Lungs were clear, heart sounds were regular.   Interrogation of her loop demonstrated no intercurrent events.   Will plan to see her again in 6 months' time.     [name], MD, [facility]  Electronically Signed    SCK/MedQ  DD: 04/07/2006  DT: 04/07/2006  Job #: [id number]

## 2010-05-25 NOTE — Op Note (Signed)
Rita [name], DENARDO NO.:  [id number]   MEDICAL RECORD NO.:  [id number]          PATIENT TYPE:  OIB   LOCATION:  2899                         FACILITY:  [facility]   PHYSICIAN:  [name], MD, FACCDATE OF BIRTH:  06/07/1926   DATE OF PROCEDURE:  DATE OF DISCHARGE:                                 OPERATIVE REPORT   PREOPERATIVE DIAGNOSIS:  Syncope.   POSTOPERATIVE DIAGNOSIS:  Syncope.   PROCEDURE:  Insertion of an implantable loop recorder.   DESCRIPTION OF PROCEDURE:  After obtaining informed consent, the patient was  brought to the electrophysiology laboratory and placed on the fluoroscopic  table in supine position.  After routine prep and drape of the left chest,  an incision was made about 2 cm lateral to the sternum and 2 cm caudal to  the clavicle and carried down to the layer of the prepectoral fascia with  electrocautery and sharp dissection.  A pocket was formed similarly.  Hemostasis was obtained.   Then two 2-0 silk sutures were placed at the cephalad portion of the pocket  and used to secure Medtronic Reveal Plus 9526 loop recorder, serial  #[id number].  The pocket was copiously irrigated with antibiotic containing  saline solution.  The pocket was then closed in 3 layers in normal fashion.  The wound was washed, dried and Benzoin and Steri-Strip dressing is applied.  Needle counts, sponge counts and instrument counts were correct at the end  of the procedure according to the staff.  The patient tolerated the  procedure without apparent complication.           ______________________________  [name], MD, [facility]     SCK/MEDQ  D:  09/27/2005  T:  09/27/2005  Job:  [id number]   cc:   Genene Churn. Love, M.D.  Electrophysiology Lab

## 2010-05-25 NOTE — Assessment & Plan Note (Signed)
[facility]                           ELECTROPHYSIOLOGY OFFICE NOTE   TAJUANA, KNISKERN                       MRN:          [id number]  DATE:10/09/2005                            DOB:          10/08/1926    Rita [name] is seen in the device clinic today, October 09, 2005, for postop  followup of her recently implanted loop recorder, procedure done by Dr.  Graciela Husbands for syncope.   Upon exam, the site looks good without redness or swelling.  Steri-Strips  were removed without incident.  There were no episodes stored in the device  at this time.  Patient is aware how to use the recorder and was reinforced  that if she should have a frank syncopal episode, to give [name] a call so we  can interrogate the device as soon as possible.  Otherwise, she will be seen  in three months time by Dr. Graciela Husbands for followup.      ______________________________  Cleatrice Burke, RN    ______________________________  [name], MD, [facility]    CF/MedQ  DD:  10/09/2005  DT:  10/10/2005  Job #:  [id number]

## 2010-05-25 NOTE — Assessment & Plan Note (Signed)
[facility]                         ELECTROPHYSIOLOGY OFFICE NOTE   VERTA, RIEDLINGER                       MRN:          [id number]  DATE:04/18/2006                            DOB:          10-18-1926    Mrs. Buchanan comes in today having had a couple of spells of  lightheadedness yesterday and feeling lightheaded all day today.   I saw her about a week and a half ago, and we reviewed the use of her  event recorder trying to clarify that it should be used for syncope.   Yesterday, in the context of a 48-hour illness manifested by some aches,  shortness of breath, and according to her daughter, decreased p.o.  intake, she got up off the sofa, felt like she was going to pass out,  sat back down on the sofa, and then on the floor.  She then got herself  up into the chair, tried to get up from the chair, and had a recurrence  of the same symptoms.  The symptoms gradually abated, and she was able  to go on about her day.   Today, she has not felt well.  She did record her blood pressure at some  point when she was feeling particularly badly, and it was about 87/56.   On examination today, her blood pressure was 138/82, her pulse was 80,  lungs were clear, heart sounds were regular.   Interrogation of her loop recorder today demonstrated that she had  demonstrated no auto-detect events.  To that end, I have instructed her  to work on isometric exercises while this acute process resolves and to  try to ameliorate against orthostatic stress.   We had reviewed the potential need for things like medicines, thigh-high  stockings, head of bed elevation as additional adjunctive therapies.  We  have also reviewed the use of the activator for the loop recorder, and a  reminder that she has 8 minutes after an event to record it.     [name], MD, [facility]  Electronically Signed    SCK/MedQ  DD: 04/18/2006  DT: 04/18/2006  Job #: [id number]

## 2010-05-25 NOTE — Assessment & Plan Note (Signed)
[name]                         ELECTROPHYSIOLOGY OFFICE NOTE   Rita [name], Rita [name]                       MRN:          [id number]  DATE:12/17/2005                            DOB:          [birth year]    [name] comes in today following a syncopal episode.  Unfortunately, her loop recorder had the automatic detections turned  off, and her patient-activated detection had been filled up a month ago,  and she had not come in to have them cleared.   This is unfortunate.  The device was reprogrammed for both auto  detection, as well as a clearing of the previous episodes.   The family was quite frustrated, and I can appreciate this.   We look forward to seeing her again after her next episode.   While auto detections are on, there is a great deal of noise, and so I  do not think routine followup in the absence of an episode will be  warranted, as it will simply be over-write of numerous false positive  detections.     [name], MD, [name]  Electronically Signed    SCK/MedQ  DD: 12/17/2005  DT: 12/18/2005  Job #: [id number]   cc:   Genene Churn. Love, M.D.

## 2010-05-25 NOTE — Assessment & Plan Note (Signed)
[facility]                              CARDIOLOGY OFFICE NOTE   BRONDA, ALFRED                       MRN:          [id number]  DATE:10/11/2005                            DOB:          January 10, 1926    PHYSICIANS:  Primary cardiologist Dr. Sherryl Manges, neurologist Dr. Avie Echevaria.   PATIENT PROFILE:  A 77 year old white female with history of syncope of  unknown etiology who presents following two episodes of chest discomfort.   PROBLEM LIST:  1. Syncope.      a.     Per patient reports negative. Pharmacologic stress test       performed at [facility] in [place]. We have requested the       results of his test, and review is pending.      b.     May 07, 2005 2D echocardiogram:  EF normal size and function. EF       55 to 65%. No regional wall motion abnormalities. No LVH. 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      c.     September 27, 2005 status post implantation of a Med[employer]onic       Reveal Plus [id number] loop recorder serial number [id number] performed by       Dr. Sherryl Manges.  2. Chronic insomnia.  3. Anxiety and depression.   HISTORY OF PRESENT ILLNESS:  A 77 year old white female with the above  problem list who is recently status post implantable loop recorder by Dr.  Graciela Husbands on September 12. She was just seen in clinic on October 3 for a wounds  check. Yesterday she was in her usual state of health when after sitting  down, she developed sudden onset of 8-9/10 retrosternal and epigastric chest  fullness and heaviness associated with numbness down bilateral arms and into  her fingertips. She also felt like her arms were weak, and that she could  not pick them up. She could move her legs, however, without any problem. She  remained awake, alert, and oriented throughout the discomfort. She sat for  about eight or ten minutes and then symptoms resolved spontaneously. She  then walked around some, and then she says piddled around  the house and  then sat down again. Approximately 30 minutes after the first episode she  had a recurrent episode of 5/10 epigastric and retrosternal heaviness and  fullness with numbness down her arms this time lasting about five minutes  and then resolving spontaneously. She has not had any recurrent symptoms  since then, but calls in to the office for an appointment. On the second  occasion she tried to move the loop recorder device over the implanted loop  monitor, but not sure if she was able to do it in time. On further  questioning she is able to vacuum three or four rooms without any  limitations. The patient feels that subjectively she can do pretty much what  she wants without much limitation, although her daughter says she gives  out during tasks. She denies any episodes of  chest pain prior to yesterday.  She denies any PND, orthopnea, dizziness, syncope or edema. Notably she was  not dizzy or lightheaded during yesterday's events.   CURRENT MEDICATIONS:  Lunesta 2 mg q.h.s. p.r.n. although she ran out of  this prescription recently and has only been sleeping about 1-2 hours per  night.   PHYSICAL EXAMINATION:  VITAL SIGNS:  She is afebrile. Heart rate 82, blood  pressure 122/60 in the left arm and 116/56 in the right arm, weight 164  pounds.  GENERAL:  A pleasant white female in no acute distress, awake, alert, and  oriented x3.  NECK:  No bruits, JVD.  LUNGS:  __________ clear to auscultation.  CARDIAC:  Regular S1, S2. No S3, S4 or murmurs.  ABDOMEN:  Round, soft, nontender, nondistended. Bowel sounds present x4.  EXTREMITIES:  Warm, dry, pink. No clubbing, cyanosis or edema. Dorsalis  pedis posterior tibialis pulses 1+ and equal bilaterally.  NEUROLOGICAL:  Grossly intact, nonfocal.   ACCESSORY CLINICAL FINDINGS:  Her EKG today shows sinus rhythm without any  acute ST-T changes.   ASSESSMENT AND PLAN:  1. History of syncope. She has a loop recorder in place, and we  have had      this interrogated without any revealing abnormal rhythms. It does not      appear that she was able to record yesterday's event. Will continue to      follow and follow up with Dr. Graciela Husbands as previously scheduled.  2. Chest pain. I discussed this with Dr. Graciela Husbands. It is not entirely clear      what went on. She did have some associated numbness in her arms as well      as weakness. However, her lower extremities worked just fine, and she      felt as though she could walk if she needed to during this episode. Her      neurological exam is not focal. The patient reportedly had a negative      Myoview this year. We have had the patient sign a release form. Will      try and obtain these records from [facility]. If in fact she had a normal      Myoview, we would hold off doing anything at this point. If she did not      have a Myoview, we would obtain one now. Otherwise, we have advised her      to continue to monitor her symptoms and report any additional chest      pain.  3. Insomnia. The patient complains of marked insomnia and sleeping only      one or two hours a night. She was previously on Lunesta, but ran out of      this medicine and her primary care physician has retired. She does not      have a new one yet. She was able to sleep about six hours while taking      Lunesta and feels that sleeping only one or two hours is significantly      impacting her day and livelihood. I have given her a prescription for      Lunesta 2 mg q.h.s. p.r.n. #30 with 0 refills. I have advised that she      obtain a new primary care physician and follow up relatively soon if      she wants to continue on Lunesta therapy.   DISPOSITION:  The patient will follow up with Dr. Graciela Husbands as previously  scheduled or sooner if necessary.      ______________________________  Nicolasa Ducking, ANP    ______________________________  Noralyn Pick. Eden Emms, MD, [facility]    CB/MedQ  DD:  10/11/2005  DT:   10/14/2005  Job #:  [id number]

## 2010-05-25 NOTE — Assessment & Plan Note (Signed)
[facility]                         ELECTROPHYSIOLOGY OFFICE NOTE   AZA, DANTES                       MRN:          [id number]  DATE:01/30/2006                            DOB:          [birth year]    Rita [name] is seen today.  She is status post loop recorder  implantation for syncope.  She has had no intercurrent events.   She also complains of her feet being cold.   On examination, her blood pressure was normal, lungs were clear, heart  sounds were regular, and the extremities were warm with good distal  pulses.   Interrogation of her loop recorder demonstrated no intercurrent  episodes.   We will plan to see her again in 2 months' time.  We revised the  programming of her device because of her multiple false detections for  bradycardia, i.e. inadequate sensitivity, and we will see if this shows  anything apart from inappropriate detections.  In the event that she  continues to have inappropriate detections, we will just plan to see her  after an event.     [name], MD, [facility]  Electronically Signed    SCK/MedQ  DD: 01/30/2006  DT: 01/30/2006  Job #: [id number]   cc:   Genene Churn. Love, M.D.

## 2010-05-31 ENCOUNTER — Ambulatory Visit (INDEPENDENT_AMBULATORY_CARE_PROVIDER_SITE_OTHER): Payer: Medicare Other | Admitting: Emergency Medicine

## 2010-05-31 ENCOUNTER — Encounter: Payer: Self-pay | Admitting: Emergency Medicine

## 2010-05-31 DIAGNOSIS — R599 Enlarged lymph nodes, unspecified: Secondary | ICD-10-CM

## 2010-05-31 DIAGNOSIS — R0602 Shortness of breath: Secondary | ICD-10-CM

## 2010-05-31 NOTE — Progress Notes (Signed)
77 yo former light smoker (1.5pk-yrs), followed by Dr Welton Flakes. Hx of Heart Block s/p pacer placement, symptomatic hypotension formerly on midodrine. Has been seen in the ED at [facility] on several occasions for acute dyspnea, ? panic attacks. She began to have these problems Fall 2011. She complains now of difficulty getting a deep breath - she does sighing breaths in the office today. No wheeze, no cough, no CP. The sighing breaths are her largest complaint, and have been the most striking abnormality seen by family. Her exertional tolerance may be decreased as well.   Eval:  ABG (02/04/10) 7.47/ 27/ 104/ 20 on RA CT scan chest (02/04/10 [name]) - No PE, no parenchymal dz, borderline R hilar and pretracheal nodes, 1.7cm subcarinal node.   ROV 05/31/10 -- follows up to discuss her dyspnea, inability to take a deep breath. Her PFT show normal AF, some evidence hyperinflation. Nothing to explain dyspnea. She returns without complaints - has had no further episodes of SOB, no panic attacks.   Gen: Pleasant, well-nourished, in no distress,  normal affect  ENT: No lesions,  mouth clear,  oropharynx clear, no postnasal drip  Neck: No JVD, no TMG, no carotid bruits  Lungs: No use of accessory muscles, no dullness to percussion, clear without rales or rhonchi  Cardiovascular: RRR, heart sounds normal, no murmur or gallops, no peripheral edema  Musculoskeletal: No deformities, no cyanosis or clubbing  Neuro: alert, non focal  Skin: Warm, no lesions or rashes

## 2010-05-31 NOTE — Assessment & Plan Note (Signed)
PFT's are reassuring, suspect this is related to her anxiety and panic attacks - no BDs at this time - reassurance - ROV if she changes

## 2010-05-31 NOTE — Assessment & Plan Note (Signed)
She needs repeat CT scan of the chest in January 2013 to follow LAD

## 2010-05-31 NOTE — Patient Instructions (Signed)
We will not start any breathing medications at this time You need a repeat CT scan of the chest in 1 year (January 2013) Follow up with Dr Welton Flakes as planned and with Dr Delton Coombes if you develop any breathing problems.

## 2010-08-08 ENCOUNTER — Ambulatory Visit (INDEPENDENT_AMBULATORY_CARE_PROVIDER_SITE_OTHER): Payer: Medicare Other | Admitting: Internal Medicine

## 2010-08-08 ENCOUNTER — Encounter: Payer: Self-pay | Admitting: Internal Medicine

## 2010-08-08 DIAGNOSIS — R55 Syncope and collapse: Secondary | ICD-10-CM

## 2010-08-08 DIAGNOSIS — R42 Dizziness and giddiness: Secondary | ICD-10-CM

## 2010-08-08 DIAGNOSIS — Z950 Presence of cardiac pacemaker: Secondary | ICD-10-CM

## 2010-08-08 DIAGNOSIS — I442 Atrioventricular block, complete: Secondary | ICD-10-CM

## 2010-08-08 MED ORDER — FLUDROCORTISONE ACETATE 0.1 MG PO TABS: ORAL_TABLET | ORAL | Status: DC

## 2010-08-08 NOTE — Assessment & Plan Note (Signed)
Stable post pacing 

## 2010-08-08 NOTE — Assessment & Plan Note (Signed)
The patient's device was interrogated.  The information was reviewed. No changes were made in the programming.    

## 2010-08-08 NOTE — Assessment & Plan Note (Signed)
She continues to have episodes of syncope. She is quite orthostatic today on evaluation. We discussed treatment alternatives. These included salt water repletion, support stockings, and vasomotor agents. She would prefer the latter. We'll begin her on Florinef. We discussed side effects and the need for monitoring potassium and Blood pressure. She has had copious blood work undertaken because of her weight loss. I'm going to assume based on the fact that her family tells [name] that the blood work was normal and intact we can start this.  She will follow up with her PCP in 2 weeks for recheck of her metabolic profile and blood pressure.

## 2010-08-08 NOTE — Progress Notes (Signed)
  HPI  Rita [name] is a 77 y.o. female seen in followup for syncope in the context of intermittent complete heart block which has been felt to be autonomic in origin.  Intermittent CHB was identified on previously implanted ILR with device change in 2008  Prev LV function 2007 normal LVEF Myoview  2009 noormal  She has lost 40 lbs in the last year, and her workup is here todate negative   She has had   recurrent syncope, Events for which she has no recollection. Afebrile primarily last fall. She continues to have orthostatic Lightheadedness .  Past Medical History  Diagnosis Date  . Syncope   . Dysautonomia   . Hypothyroidism   . GERD (gastroesophageal reflux disease)   . Rheumatoid arthritis     on MTX since 2010    Past Surgical History  Procedure Date  . Pacemaker placement     AV (S/P)  . Cholecystectomy   . Other surgical history     LOOP recorder    Current Outpatient Prescriptions  Medication Sig Dispense Refill  . citalopram (CELEXA) 10 MG tablet Take 10 mg by mouth daily.        . folic acid (FOLVITE) 1 MG tablet Take 1 mg by mouth daily.        . methotrexate (RHEUMATREX) 2.5 MG tablet Take 10 tablets every Tuesday       . omeprazole (PRILOSEC) 20 MG capsule daily.       Marland Kitchen zolpidem (AMBIEN) 5 MG tablet Take 5 mg by mouth at bedtime.          Allergies  Allergen Reactions  . Sulfonamide Derivatives     Review of Systems negative except from HPI and PMH  Physical Exam Well developed and well nourished but  thin Caucasian female HENT normal E scleral and icterus clear Neck Supple JVP flat; carotids brisk and full Clear to ausculation Regular rate and rhythm, no murmurs gallops or rub Soft with active bowel sounds No clubbing cyanosis and edema Alert and oriented, grossly normal motor and sensory function Skin Warm and Dry  ECG Sinus rhythm at 71 Intervals 0.17/09/40 Axis is -3  Assessment and  Plan

## 2010-08-08 NOTE — Assessment & Plan Note (Signed)
Will begin florninef as above

## 2010-08-08 NOTE — Patient Instructions (Signed)
Your physician has recommended you make the following change in your medication:  1) Start Florinef 0.1mg  one tablet by mouth twice daily.  Your physician recommends that you lab work & a blood pressure check in: 2 weeks at your Primary Care office. You have been given an order for labwork today. Please take this to your lab appointment with you PCP- bmp ([phone])  Remote monitoring is used to monitor your Pacemaker of ICD from home. This monitoring reduces the number of office visits required to check your device to one time per year. It allows [name] to keep an eye on the functioning of your device to ensure it is working properly. You are scheduled for a device check from home on 11/08/10. You may send your transmission at any time that day. If you have a wireless device, the transmission will be sent automatically. After your physician reviews your transmission, you will receive a postcard with your next transmission date.  Your physician wants you to follow-up in: 1 year. You will receive a reminder letter in the mail two months in advance. If you don't receive a letter, please call our office to schedule the follow-up appointment.

## 2010-08-23 ENCOUNTER — Encounter: Payer: Self-pay | Admitting: Internal Medicine

## 2010-11-08 ENCOUNTER — Encounter: Payer: Medicare Other | Admitting: *Deleted

## 2010-11-13 ENCOUNTER — Encounter: Payer: Self-pay | Admitting: *Deleted

## 2011-03-18 ENCOUNTER — Encounter: Payer: Self-pay | Admitting: *Deleted

## 2011-03-27 ENCOUNTER — Encounter: Payer: Self-pay | Admitting: Internal Medicine

## 2011-07-17 ENCOUNTER — Inpatient Hospital Stay (HOSPITAL_COMMUNITY): Payer: Medicare Other

## 2011-07-17 ENCOUNTER — Inpatient Hospital Stay (HOSPITAL_COMMUNITY)
Admission: AD | Admit: 2011-07-17 | Discharge: 2011-07-22 | DRG: 552 | Disposition: A | Discharge status: Skilled Nursing Facility | Payer: Medicare Other | Source: Ambulatory Visit | Attending: Orthopedic Surgery | Admitting: Orthopedic Surgery

## 2011-07-17 ENCOUNTER — Other Ambulatory Visit: Payer: Self-pay | Admitting: Surgical

## 2011-07-17 DIAGNOSIS — K219 Gastro-esophageal reflux disease without esophagitis: Secondary | ICD-10-CM | POA: Diagnosis present

## 2011-07-17 DIAGNOSIS — E039 Hypothyroidism, unspecified: Secondary | ICD-10-CM | POA: Diagnosis present

## 2011-07-17 DIAGNOSIS — M069 Rheumatoid arthritis, unspecified: Secondary | ICD-10-CM | POA: Diagnosis present

## 2011-07-17 DIAGNOSIS — G909 Disorder of the autonomic nervous system, unspecified: Secondary | ICD-10-CM | POA: Diagnosis present

## 2011-07-17 DIAGNOSIS — M25559 Pain in unspecified hip: Secondary | ICD-10-CM | POA: Diagnosis present

## 2011-07-17 DIAGNOSIS — Z950 Presence of cardiac pacemaker: Secondary | ICD-10-CM

## 2011-07-17 DIAGNOSIS — M48061 Spinal stenosis, lumbar region without neurogenic claudication: Principal | ICD-10-CM | POA: Diagnosis present

## 2011-07-17 DIAGNOSIS — M5126 Other intervertebral disc displacement, lumbar region: Secondary | ICD-10-CM

## 2011-07-17 LAB — COMPREHENSIVE METABOLIC PANEL
ALT: 19 U/L (ref 0–35)
AST: 28 U/L (ref 0–37)
Albumin: 3 g/dL — ABNORMAL LOW (ref 3.5–5.2)
Alkaline Phosphatase: 110 U/L (ref 39–117)
BUN: 13 mg/dL (ref 6–23)
CO2: 26 mEq/L (ref 19–32)
Calcium: 8.3 mg/dL — ABNORMAL LOW (ref 8.4–10.5)
Chloride: 103 mEq/L (ref 96–112)
Creatinine, Ser: 0.65 mg/dL (ref 0.50–1.10)
GFR calc Af Amer: >90 mL/min (ref >90)
GFR calc non Af Amer: 80 mL/min — ABNORMAL LOW (ref >90)
Glucose, Bld: 132 mg/dL — ABNORMAL HIGH (ref 70–99)
Potassium: 3.2 mEq/L — ABNORMAL LOW (ref 3.5–5.1)
Sodium: 135 mEq/L (ref 135–145)
Total Bilirubin: 0.3 mg/dL (ref 0.3–1.2)
Total Protein: 6.2 g/dL (ref 6.0–8.3)

## 2011-07-17 LAB — DIFFERENTIAL
Basophils Absolute: 0.1 10*3/uL (ref 0.0–0.1)
Basophils Relative: 1 % (ref 0–1)
Eosinophils Absolute: 0.1 10*3/uL (ref 0.0–0.7)
Eosinophils Relative: 2 % (ref 0–5)
Lymphocytes Relative: 13 % (ref 12–46)
Lymphs Abs: 0.7 10*3/uL (ref 0.7–4.0)
Monocytes Absolute: 0.3 10*3/uL (ref 0.1–1.0)
Monocytes Relative: 5 % (ref 3–12)
Neutro Abs: 4.4 10*3/uL (ref 1.7–7.7)
Neutrophils Relative %: 80 % — ABNORMAL HIGH (ref 43–77)

## 2011-07-17 LAB — APTT: aPTT: 35 seconds (ref 24–37)

## 2011-07-17 LAB — CBC
HCT: 35.3 % — ABNORMAL LOW (ref 36.0–46.0)
Hemoglobin: 11.9 g/dL — ABNORMAL LOW (ref 12.0–15.0)
MCH: 33.3 pg (ref 26.0–34.0)
MCHC: 33.7 g/dL (ref 30.0–36.0)
MCV: 98.9 fL (ref 78.0–100.0)
Platelets: 234 10*3/uL (ref 150–400)
RBC: 3.57 MIL/uL — ABNORMAL LOW (ref 3.87–5.11)
RDW: 15.7 % — ABNORMAL HIGH (ref 11.5–15.5)
WBC: 5.6 10*3/uL (ref 4.0–10.5)

## 2011-07-17 LAB — PROTIME-INR
INR: 1.11 (ref 0.00–1.49)
Prothrombin Time: 14.5 seconds (ref 11.6–15.2)

## 2011-07-17 MED ORDER — METHOTREXATE 2.5 MG PO TABS
25.0000 mg | ORAL_TABLET | ORAL | Status: DC
  Administered 2011-07-17: 25 mg via ORAL
  Filled 2011-07-17: qty 10

## 2011-07-17 MED ORDER — FOLIC ACID 1 MG PO TABS
1.0000 mg | ORAL_TABLET | Freq: Every day | ORAL | Status: DC
  Administered 2011-07-18 – 2011-07-22 (×5): 1 mg via ORAL
  Filled 2011-07-17 (×5): qty 1

## 2011-07-17 MED ORDER — LACTATED RINGERS IV SOLN
INTRAVENOUS | Status: DC
  Administered 2011-07-19: 02:00:00 via INTRAVENOUS

## 2011-07-17 MED ORDER — BISACODYL 10 MG RE SUPP: 10.0000 mg | Freq: Every day | RECTAL | Status: DC | PRN

## 2011-07-17 MED ORDER — FLEET ENEMA 7-19 GM/118ML RE ENEM: 1.0000 | ENEMA | Freq: Once | RECTAL | Status: AC | PRN

## 2011-07-17 MED ORDER — ONDANSETRON HCL 4 MG PO TABS: 4.0000 mg | ORAL_TABLET | Freq: Four times a day (QID) | ORAL | Status: DC | PRN

## 2011-07-17 MED ORDER — SODIUM CHLORIDE 0.9 % IJ SOLN
3.0000 mL | Freq: Two times a day (BID) | INTRAMUSCULAR | Status: DC
  Administered 2011-07-19 – 2011-07-21 (×5): 3 mL via INTRAVENOUS

## 2011-07-17 MED ORDER — DONEPEZIL HCL 10 MG PO TABS
10.0000 mg | ORAL_TABLET | Freq: Every day | ORAL | Status: DC
  Administered 2011-07-17 – 2011-07-21 (×5): 10 mg via ORAL
  Filled 2011-07-17 (×6): qty 1

## 2011-07-17 MED ORDER — PANTOPRAZOLE SODIUM 40 MG PO TBEC
40.0000 mg | DELAYED_RELEASE_TABLET | Freq: Every day | ORAL | Status: DC
  Administered 2011-07-18 – 2011-07-22 (×5): 40 mg via ORAL
  Filled 2011-07-17 (×5): qty 1

## 2011-07-17 MED ORDER — NAPROXEN 500 MG PO TABS
500.0000 mg | ORAL_TABLET | Freq: Two times a day (BID) | ORAL | Status: DC
  Administered 2011-07-18 – 2011-07-22 (×9): 500 mg via ORAL
  Filled 2011-07-17 (×11): qty 1

## 2011-07-17 MED ORDER — HYDROCODONE-ACETAMINOPHEN 5-325 MG PO TABS
1.0000 | ORAL_TABLET | ORAL | Status: DC | PRN
  Administered 2011-07-18 – 2011-07-19 (×4): 1 via ORAL
  Administered 2011-07-19: 2 via ORAL
  Administered 2011-07-19 (×2): 1 via ORAL
  Filled 2011-07-17 (×3): qty 1
  Filled 2011-07-17: qty 2
  Filled 2011-07-17 (×3): qty 1

## 2011-07-17 MED ORDER — FLUDROCORTISONE ACETATE 0.1 MG PO TABS
0.1000 mg | ORAL_TABLET | Freq: Two times a day (BID) | ORAL | Status: DC
  Administered 2011-07-18 – 2011-07-22 (×9): 0.1 mg via ORAL
  Filled 2011-07-17 (×11): qty 1

## 2011-07-17 MED ORDER — POLYETHYLENE GLYCOL 3350 17 G PO PACK: 17.0000 g | PACK | Freq: Every day | ORAL | Status: DC | PRN

## 2011-07-17 MED ORDER — SODIUM CHLORIDE 0.9 % IJ SOLN: 3.0000 mL | INTRAMUSCULAR | Status: DC | PRN

## 2011-07-17 MED ORDER — ACETAMINOPHEN 650 MG RE SUPP: 650.0000 mg | Freq: Four times a day (QID) | RECTAL | Status: DC | PRN

## 2011-07-17 MED ORDER — MIRTAZAPINE 30 MG PO TABS
30.0000 mg | ORAL_TABLET | Freq: Every day | ORAL | Status: DC
  Administered 2011-07-17 – 2011-07-21 (×5): 30 mg via ORAL
  Filled 2011-07-17 (×6): qty 1

## 2011-07-17 MED ORDER — CEPHALEXIN 500 MG PO CAPS
500.0000 mg | ORAL_CAPSULE | Freq: Four times a day (QID) | ORAL | Status: DC
  Administered 2011-07-17 – 2011-07-22 (×18): 500 mg via ORAL
  Filled 2011-07-17 (×21): qty 1

## 2011-07-17 MED ORDER — ASPIRIN EC 81 MG PO TBEC
81.0000 mg | DELAYED_RELEASE_TABLET | Freq: Every day | ORAL | Status: DC
  Administered 2011-07-17: 81 mg via ORAL
  Filled 2011-07-17 (×2): qty 1

## 2011-07-17 MED ORDER — HYDROXYCHLOROQUINE SULFATE 200 MG PO TABS
200.0000 mg | ORAL_TABLET | Freq: Two times a day (BID) | ORAL | Status: DC
  Administered 2011-07-17 – 2011-07-22 (×10): 200 mg via ORAL
  Filled 2011-07-17 (×11): qty 1

## 2011-07-17 MED ORDER — ACETAMINOPHEN 325 MG PO TABS
650.0000 mg | ORAL_TABLET | Freq: Four times a day (QID) | ORAL | Status: DC | PRN
  Administered 2011-07-22: 650 mg via ORAL
  Filled 2011-07-17: qty 2

## 2011-07-17 MED ORDER — ONDANSETRON HCL 4 MG/2ML IJ SOLN: 4.0000 mg | Freq: Four times a day (QID) | INTRAMUSCULAR | Status: DC | PRN

## 2011-07-17 MED ORDER — SODIUM CHLORIDE 0.9 % IV SOLN: 250.0000 mL | INTRAVENOUS | Status: DC | PRN

## 2011-07-17 MED ORDER — HYDROMORPHONE HCL PF 1 MG/ML IJ SOLN: 1.0000 mg | INTRAMUSCULAR | Status: DC | PRN

## 2011-07-17 NOTE — H&P (Signed)
Rita [name] is an 78 y.o. female.   Chief Complaint: left hip pain HPI: Rita [name] started having trouble with left hip and back pain on Saturday July 13, 2011. Rita [name] denies any injury or fall. Rita [name] continued to worsen over the weekend. Rita [name] presented to the ED on Monday July 15, 2011 and had x-rays of the hip. No fractures were found but Rita [name] was told that Rita [name] has osteoarthritis in the hip and in the back. Rita [name] is a very independent woman but over the past week Rita [name] has been less able to care for herself because Rita [name] has intense pain with weightbearing. Rita [name] has been dealing with left ankle pain with no known injury for a couple months. Rita [name] also has a history of osteoporosis. Rita [name] does currently have a UTI for which Rita [name] is on an antibiotic.   Past Medical History  Diagnosis Date  . Syncope   . Dysautonomia   . Hypothyroidism   . GERD (gastroesophageal reflux disease)   . Rheumatoid arthritis     on MTX since 2010    Past Surgical History  Procedure Date  . Pacemaker placement     AV (S/P)  . Cholecystectomy   . Other surgical history     LOOP recorder    Family History  Problem Relation Age of Onset  . Colon cancer Mother    Social History:  reports that Rita [name] has quit smoking. Her smoking use included Cigarettes. Rita [name] has a .5 pack-year smoking history. Rita [name] has never used smokeless tobacco. Her alcohol and drug histories not on file.  Allergies:  Allergies  Allergen Reactions  . Sulfonamide Derivatives    Medications Methotrexate Naproxen Folic acid Omeprazole Fludrocortisone Mirtazapine Hydroxychloroquine Sulfate Keflex  Review of Systems  Constitutional: Negative.   HENT: Positive for hearing loss. Negative for ear pain, congestion, sore throat, neck pain, tinnitus and ear discharge.   Eyes: Negative.   Respiratory: Negative.  Negative for stridor.   Cardiovascular: Negative.   Gastrointestinal: Negative.   Genitourinary: Positive for dysuria. Negative for urgency, frequency,  hematuria and flank pain.  Musculoskeletal: Positive for back pain and joint pain. Negative for myalgias and falls.  Skin: Negative.   Neurological: Negative.  Negative for headaches.  Endo/Heme/Allergies: Negative.   Psychiatric/Behavioral: Negative.     Physical Exam  Constitutional: Rita [name] is oriented to person, place, and time. Rita [name] appears well-developed and well-nourished. No distress.  HENT:  Head: Normocephalic and atraumatic.  Right Ear: External ear normal.  Left Ear: External ear normal.  Nose: Nose normal.  Mouth/Throat: Oropharynx is clear and moist.  Eyes: EOM are normal.  Neck: Normal range of motion. Neck supple. No tracheal deviation present. No thyromegaly present.  Cardiovascular: Normal rate, regular rhythm, normal heart sounds and intact distal pulses.   No murmur heard. Respiratory: Effort normal and breath sounds normal. No respiratory distress. Rita [name] has no wheezes. Rita [name] exhibits no tenderness.  GI: Bowel sounds are normal. Rita [name] exhibits no distension and no mass. There is no tenderness. There is no rebound and no guarding.  Musculoskeletal:       Right hip: Normal.       Left hip: Rita [name] exhibits decreased range of motion, tenderness and bony tenderness.       Right knee: Normal.       Left knee: Normal.       Right ankle: Normal.       Left ankle: Rita [name] exhibits swelling.       Lumbar back: Rita [name] exhibits decreased range of  motion, tenderness and pain.       Legs: Lymphadenopathy:    Rita [name] has no cervical adenopathy.  Neurological: Rita [name] is alert and oriented to person, place, and time. Rita [name] has normal strength and normal reflexes. No sensory deficit.  Skin: Rita [name] is not diaphoretic.  Psychiatric: Rita [name] has a normal mood and affect.     Assessment/Plan Possible stress fracture left hip X-rays of the lumbar spine show that Rita [name] has degenerative disc disease. No fractures seen in the left hip on x-ray. We are going to admit her for pain control and tests. Rita [name] needs a CT of the  left hip to rule out a stress fracture. There is a chance that Rita [name] a disc herniation as well. We will evaluate this depending on what the CT of the hip shows.   Roran Wegner LAUREN 07/17/2011, 7:03 PM

## 2011-07-18 ENCOUNTER — Inpatient Hospital Stay (HOSPITAL_COMMUNITY): Payer: Medicare Other

## 2011-07-18 ENCOUNTER — Encounter (HOSPITAL_COMMUNITY): Payer: Self-pay | Admitting: Orthopedic Surgery

## 2011-07-18 MED ORDER — IOHEXOL 180 MG/ML  SOLN
15.0000 mL | Freq: Once | INTRAMUSCULAR | Status: AC | PRN
  Administered 2011-07-18: 15 mL via INTRATHECAL

## 2011-07-18 NOTE — Progress Notes (Signed)
Subjective: Rita [name] is continuing to have significant left hip and leg pain. CT performed last night of left hip was unremarkable. She reports that her pain is somewhat better when resting in the bed but when she moves the leg or bears weight on it she has increased pain. She says that she has pain from her hip to her heel. No complaints of shortness of breath or chest pain.    Objective: Vital signs in last 24 hours: Temp:  [98.2 F (36.8 C)-98.3 F (36.8 C)] 98.3 F (36.8 C) (07/11 1610) Pulse Rate:  [71-76] 71  (07/11 0614) Resp:  [16] 16  (07/11 0614) BP: (138-156)/(72-75) 156/72 mmHg (07/11 0614) SpO2:  [94 %-97 %] 94 % (07/11 0614) Weight:  [70.308 kg (155 lb)] 70.308 kg (155 lb) (07/10 1900)  Intake/Output from previous day: 07/10 0701 - 07/11 0700 In: 360 [P.O.:120; I.V.:240] Out: 1950 [Urine:1950]   Basename 07/17/11 2053  HGB 11.9*    Basename 07/17/11 2053  WBC 5.6  RBC 3.57*  HCT 35.3*  PLT 234    Basename 07/17/11 2053  NA 135  K 3.2*  CL 103  CO2 26  BUN 13  CREATININE 0.65  GLUCOSE 132*  CALCIUM 8.3*    Basename 07/17/11 2053  LABPT --  INR 1.11    Neurologically intact Neurovascular intact Dorsiflexion/Plantar flexion intact Pain with motion of the left hip into the groin and anterior thigh Tenderness to palpation over the left distal femur  Assessment/Plan: We are going to get a portable x-ray of the left femur to rule out a stress fracture. If the x-ray is questionable we will get a CT of the left femur. If the x-ray is completely normal we are going to get a CT myelogram of the lumbar spine to check for disc herniation. She cannot have a MRI because she has a pacemaker. We are taking her off of the aspirin for the time being in case she needs the myelogram. Continue bed rest and pain management.    Khoen Genet LAUREN 07/18/2011, 7:24 AM

## 2011-07-19 ENCOUNTER — Inpatient Hospital Stay (HOSPITAL_COMMUNITY): Payer: Medicare Other

## 2011-07-19 MED ORDER — IOHEXOL 300 MG/ML  SOLN: 2.0000 mL | Freq: Once | INTRAMUSCULAR | Status: AC | PRN

## 2011-07-19 MED ORDER — METHYLPREDNISOLONE ACETATE 40 MG/ML IJ SUSP
INTRAMUSCULAR | Status: AC
  Filled 2011-07-19: qty 5

## 2011-07-19 MED ORDER — PREDNISONE (PAK) 10 MG PO TABS
20.0000 mg | ORAL_TABLET | Freq: Every morning | ORAL | Status: AC
  Administered 2011-07-19: 20 mg via ORAL
  Filled 2011-07-19: qty 21

## 2011-07-19 MED ORDER — METHYLPREDNISOLONE ACETATE 80 MG/ML IJ SUSP
INTRAMUSCULAR | Status: AC
  Filled 2011-07-19: qty 1

## 2011-07-19 MED ORDER — PREDNISONE (PAK) 10 MG PO TABS
10.0000 mg | ORAL_TABLET | ORAL | Status: AC
  Administered 2011-07-19: 10 mg via ORAL

## 2011-07-19 MED ORDER — LIDOCAINE HCL (PF) 1 % IJ SOLN
INTRAMUSCULAR | Status: AC
  Filled 2011-07-19: qty 30

## 2011-07-19 MED ORDER — PREDNISONE (PAK) 10 MG PO TABS
10.0000 mg | ORAL_TABLET | Freq: Four times a day (QID) | ORAL | Status: DC
  Administered 2011-07-21 – 2011-07-22 (×6): 10 mg via ORAL

## 2011-07-19 MED ORDER — PREDNISONE (PAK) 10 MG PO TABS
20.0000 mg | ORAL_TABLET | Freq: Every evening | ORAL | Status: AC
  Administered 2011-07-20: 20 mg via ORAL

## 2011-07-19 MED ORDER — PREDNISONE (PAK) 10 MG PO TABS
10.0000 mg | ORAL_TABLET | Freq: Three times a day (TID) | ORAL | Status: AC
  Administered 2011-07-20 (×3): 10 mg via ORAL

## 2011-07-19 MED ORDER — HYDROCODONE-ACETAMINOPHEN 5-325 MG PO TABS: 1.0000 | ORAL_TABLET | ORAL | Status: AC | PRN

## 2011-07-19 MED ORDER — PREDNISONE (PAK) 10 MG PO TABS
20.0000 mg | ORAL_TABLET | Freq: Every evening | ORAL | Status: AC
  Administered 2011-07-19: 20 mg via ORAL

## 2011-07-19 NOTE — Progress Notes (Signed)
Subjective: Slight improvement in left leg pain. Will start Prednisone Dosepak and Schedule for Selective Nerve Root Block at L-4--L-5 on left. She lives alone and is unable to ambulate on her own.Neurologically intact.   Objective: Vital signs in last 24 hours: Temp:  [98.1 F (36.7 C)-99.1 F (37.3 C)] 99.1 F (37.3 C) (07/11 2136) Pulse Rate:  [73-76] 76  (07/11 2136) Resp:  [16] 16  (07/11 2136) BP: (107-146)/(65) 146/65 mmHg (07/11 2136) SpO2:  [94 %-95 %] 94 % (07/11 2136)  Intake/Output from previous day: 07/11 0701 - 07/12 0700 In: 1073.3 [P.O.:240; I.V.:833.3] Out: 150 [Urine:150] Intake/Output this shift:     Basename 07/17/11 2053  HGB 11.9*    Basename 07/17/11 2053  WBC 5.6  RBC 3.57*  HCT 35.3*  PLT 234    Basename 07/17/11 2053  NA 135  K 3.2*  CL 103  CO2 26  BUN 13  CREATININE 0.65  GLUCOSE 132*  CALCIUM 8.3*    Basename 07/17/11 2053  LABPT --  INR 1.11    Neurologically intact  Assessment/Plan: Radiology to Inject L-4--L-5 on the left.   Ezequiel Macauley A 07/19/2011, 7:24 AM

## 2011-07-19 NOTE — Evaluation (Signed)
Physical Therapy Evaluation Patient Details Name: Rita [name] MRN: [id number] DOB: 11/07/1926 Today's Date: 07/19/2011 Time: 6578-4696 PT Time Calculation (min): 32 min  PT Assessment / Plan / Recommendation Clinical Impression  Pt presenting with significant limitations in functional mobility 2* to pain and weakness.   In current condition pt is unsafe to return home with limited assist.    PT Assessment  Patient needs continued PT services    Follow Up Recommendations  Skilled nursing facility    Barriers to Discharge        Equipment Recommendations  Defer to next venue    Recommendations for Other Services     Frequency Min 3X/week    Precautions / Restrictions Precautions Precautions: Fall Restrictions Weight Bearing Restrictions: No   Pertinent Vitals/Pain Pt c/o intermittant L LE pain but unable to rate      Mobility  Bed Mobility Bed Mobility: Supine to Sit Supine to Sit: 3: Mod assist Details for Bed Mobility Assistance: mod assist and multimodal cues for log roll and move to sitting Transfers Transfers: Sit to Stand;Stand to Sit Sit to Stand: 3: Mod assist;4: Min assist Stand to Sit: 3: Mod assist;4: Min assist Details for Transfer Assistance: cues for Le placement and use of UEs to self assist Ambulation/Gait Ambulation/Gait Assistance: 3: Mod assist Ambulation Distance (Feet): 28 Feet Assistive device: Rolling walker Ambulation/Gait Assistance Details: cues for sequence, position from RW, stride length, and posture,   Gait Pattern: Step-to pattern;Decreased step length - right;Decreased step length - left;Shuffle Gait velocity: slow    Exercises     PT Diagnosis: Difficulty walking  PT Problem [name]:   PT Treatment Interventions: DME instruction;Gait training;Stair training;Functional mobility training;Therapeutic activities;Therapeutic exercise;Patient/family education   PT Goals Acute Rehab PT Goals PT Goal Formulation: With patient Time For  Goal Achievement: 07/25/11 Potential to Achieve Goals: Fair Pt will go Supine/Side to Sit: with min assist PT Goal: Supine/Side to Sit - Progress: Goal set today Pt will go Sit to Supine/Side: with min assist PT Goal: Sit to Supine/Side - Progress: Goal set today Pt will go Sit to Stand: with min assist PT Goal: Sit to Stand - Progress: Goal set today Pt will go Stand to Sit: with min assist PT Goal: Stand to Sit - Progress: Goal set today Pt will Ambulate: 16 - 50 feet;with min assist;with rolling walker PT Goal: Ambulate - Progress: Goal set today  Visit Information  Last PT Received On: 07/19/11 Assistance Needed: +1    Subjective Data  Subjective: I could barely walk at home; I wasn't getting out of bed much Patient Stated Goal: I'd really like to go home but understand if I go to rehab   Prior Functioning  Home Living Lives With: Alone Available Help at Discharge: Family Type of Home: House Home Access: Stairs to enter Secretary/administrator of Steps: 2 Entrance Stairs-Rails: Right Home Layout: One level Home Adaptive Equipment: Walker - rolling Prior Function Level of Independence: Needs assistance Needs Assistance: Light Housekeeping;Gait Gait Assistance: RW, ltd distance 2* pain, hx of falls Vocation: Retired Musician: No difficulties;HOH    Cognition  Overall Cognitive Status: Impaired Area of Impairment: Memory Arousal/Alertness: Awake/alert Orientation Level: Situation Behavior During Session: WFL for tasks performed Memory Deficits: no carryover of information during session    Extremity/Trunk Assessment Right Upper Extremity Assessment RUE ROM/Strength/Tone: [facility] for tasks assessed Left Upper Extremity Assessment LUE ROM/Strength/Tone: [facility] for tasks assessed Right Lower Extremity Assessment RLE ROM/Strength/Tone: [facility] for tasks assessed Left Lower Extremity  Assessment LLE ROM/Strength/Tone: WFL for tasks assessed   Balance    End  of Session PT - End of Session Equipment Utilized During Treatment: Gait belt Activity Tolerance: Patient limited by fatigue;Patient limited by pain Patient left: in chair;with call bell/phone within reach Nurse Communication: Mobility status  GP     [place] 07/19/2011, 1:19 PM

## 2011-07-19 NOTE — Progress Notes (Signed)
Clinical Social Work Department CLINICAL SOCIAL WORK PLACEMENT NOTE 07/19/2011  Patient:  ASHANI, PUMPHREY  Account Number:  [id number] Admit date:  07/17/2011  Clinical Social Worker:  Cori Razor, LCSW  Date/time:  07/19/2011 07:56 AM  Clinical Social Work is seeking post-discharge placement for this patient at the following level of care:   SKILLED NURSING   (*CSW will update this form in Epic as items are completed)   07/18/2011  Patient/family provided with [facility] Department of Clinical Social Work's list of facilities offering this level of care within the geographic area requested by the patient (or if unable, by the patient's family).  07/18/2011  Patient/family informed of their freedom to choose among providers that offer the needed level of care, that participate in Medicare, Medicaid or managed care program needed by the patient, have an available bed and are willing to accept the patient.    Patient/family informed of [facility]' ownership interest in [facility], as well as of the fact that they are under no obligation to receive care at this facility.  PASARR submitted to EDS on 07/18/2011 PASARR number received from EDS on 07/18/2011  FL2 transmitted to all facilities in geographic area requested by pt/family on  07/19/2011 FL2 transmitted to all facilities within larger geographic area on   Patient informed that his/her managed care company has contracts with or will negotiate with  certain facilities, including the following:     Patient/family informed of bed offers received:   Patient chooses bed at  Physician recommends and patient chooses bed at    Patient to be transferred to  on   Patient to be transferred to facility by   The following physician request were entered in Epic:   Additional Comments:  Cori Razor LCSW [phone]

## 2011-07-19 NOTE — Procedures (Signed)
Left L4-5 TF ESI. 120 mg Depomedrol.

## 2011-07-20 NOTE — Progress Notes (Signed)
Subjective: Feeling better post-lumbar injection   Objective: Vital signs in last 24 hours: Temp:  [98 F (36.7 C)-98.5 F (36.9 C)] 98.5 F (36.9 C) (07/13 0617) Pulse Rate:  [67-75] 67  (07/13 0617) Resp:  [16] 16  (07/13 0617) BP: (122-144)/(63-74) 132/74 mmHg (07/13 0617) SpO2:  [95 %-96 %] 95 % (07/13 0617)  Intake/Output from previous day: 07/12 0701 - 07/13 0700 In: 840 [P.O.:840] Out: 4500 [Urine:4500] Intake/Output this shift: Total I/O In: 243 [P.O.:240; I.V.:3] Out: -    Basename 07/17/11 2053  HGB 11.9*    Basename 07/17/11 2053  WBC 5.6  RBC 3.57*  HCT 35.3*  PLT 234    Basename 07/17/11 2053  NA 135  K 3.2*  CL 103  CO2 26  BUN 13  CREATININE 0.65  GLUCOSE 132*  CALCIUM 8.3*    Basename 07/17/11 2053  LABPT --  INR 1.11    Sensation intact distally Dorsiflexion/Plantar flexion intact  Assessment/Plan: Lower back and left leg pain- Feeling better subjectively. Continue PT. Plan was for SNF on Monday. Patient would prefer going home but may not be physically capable of doing so.   Rita [name] 07/20/2011, 10:04 AM

## 2011-07-21 NOTE — Progress Notes (Signed)
Subjective: Patient feeling better today. Eager to get up OOB   Objective: Vital signs in last 24 hours: Temp:  [97.1 F (36.2 C)-98.7 F (37.1 C)] 97.1 F (36.2 C) (07/14 0624) Pulse Rate:  [63-76] 63  (07/14 0624) Resp:  [16] 16  (07/14 0624) BP: (127-153)/(69-73) 153/73 mmHg (07/14 0624) SpO2:  [92 %-96 %] 94 % (07/14 0624)  Intake/Output from previous day: 07/13 0701 - 07/14 0700 In: 243 [P.O.:240; I.V.:3] Out: 525 [Urine:525] Intake/Output this shift: Total I/O In: 240 [P.O.:240] Out: -   No results found for this basename: HGB:5 in the last 72 hours No results found for this basename: WBC:2,RBC:2,HCT:2,PLT:2 in the last 72 hours No results found for this basename: NA:2,K:2,CL:2,CO2:2,BUN:2,CREATININE:2,GLUCOSE:2,CALCIUM:2 in the last 72 hours No results found for this basename: LABPT:2,INR:2 in the last 72 hours  No change in physical exam  Assessment/Plan: Lumbar pain- doing better. OOB today. SNF vs. Home tomorrow   Rita [name] 07/21/2011, 8:49 AM

## 2011-07-21 NOTE — Discharge Summary (Signed)
Physician Discharge Summary  Patient ID: Rita [name] MRN: [id number] DOB/AGE: [birth year] 76 y.o.  Admit date: 07/17/2011 Discharge date: 07/21/2011 Change DC to 07/22/2011  Admission Diagnoses:Spinal Stenosis Lumbar  Discharge Diagnoses: Spinal Stenosis Lumbar Active Problems:  * No active hospital problems. *    Discharged Condition: Improved  Hospital Course: She had a normal CT Scan of her hip and a normal xray of her Femur. She also had a CT Myelogram and a Epidural Steroid injection by the Radiologist.  Consults:Radiologist  Significant Diagnostic Studies: CT Myelogram,CT Left Hip,Xray of Left Femur and a Epidural Steroid Injection ,Lumbar.  Treatments:Pain Medications and Prednisone by mouth and Epidural Steroid Injection,Lumbar.  Discharge Exam: Blood pressure 103/61, pulse 60, temperature 96.8 F (36 C), temperature source Axillary, resp. rate 16, height 5\' 3"  (1.6 m), weight 70.308 kg (155 lb), SpO2 96.00%. Pulses: 2+ and symmetric normal Neurological exam.  Disposition:  DC to SNF Discharge Orders    Future Orders Please Complete By Expires   Diet - low sodium heart healthy      Call MD / Call 911      Comments:   If you experience chest pain or shortness of breath, CALL 911 and be transported to the hospital emergency room.  If you develope a fever above 101 F, pus (white drainage) or increased drainage or redness at the wound, or calf pain, call your surgeon's office.   Constipation Prevention      Comments:   Drink plenty of fluids.  Prune juice may be helpful.  You may use a stool softener, such as Colace (over the counter) 100 mg twice a day.  Use MiraLax (over the counter) for constipation as needed.   Increase activity slowly as tolerated      Discharge instructions      Comments:   Walk with your walker   Weight bearing as tolerated      Lifting restrictions      Comments:   No lifting   Vital signs      Comments:   VS on return to floor.      Medication List  As of 07/21/2011  8:15 PM   STOP taking these medications         fludrocortisone 0.1 MG tablet         TAKE these medications         cephALEXin 500 MG capsule   Commonly known as: KEFLEX   Take 500 mg by mouth 4 (four) times daily.      donepezil 10 MG tablet   Commonly known as: ARICEPT   Take 10 mg by mouth at bedtime.      folic acid 1 MG tablet   Commonly known as: FOLVITE   Take 1 mg by mouth daily.      HYDROcodone-acetaminophen 5-325 MG per tablet   Commonly known as: NORCO   Take 1-2 tablets by mouth every 4 (four) hours as needed.      hydroxychloroquine 200 MG tablet   Commonly known as: PLAQUENIL   Take 200 mg by mouth 2 (two) times daily.      methotrexate 2.5 MG tablet   Commonly known as: RHEUMATREX   Take 25 mg by mouth once a week. Caution:Chemotherapy. Protect from light.      mirtazapine 30 MG tablet   Commonly known as: REMERON   Take 30 mg by mouth at bedtime.      naproxen 500 MG tablet   Commonly known as: NAPROSYN  Take 500 mg by mouth 2 (two) times daily with a meal.      omeprazole 20 MG capsule   Commonly known as: PRILOSEC   daily.             Signed: Aparna Vanderweele A 07/21/2011, 8:15 PM

## 2011-07-21 NOTE — Discharge Summary (Signed)
Physician Discharge Summary  Patient ID: Rita [name] MRN: [id number] DOB/AGE: 12-Mar-1926 78 y.o.  Admit date: 07/17/2011 Discharge date: 07/21/2011  Admission Diagnoses:Spinal Stenosis Lumbar  Discharge Diagnoses: Spinal Stenosis,Lumbar. Active Problems:  * No active hospital problems. *    Discharged Condition:Improved  Hospital Course: She had a normal CT of her Left Hip and her Myelogram showed Lateral Recess Stenosis,Lumbar.She was started on Prednisone by mouth and had an Epidural Steroid injection by Radiologist at L-4--L-5 on the left.  Consults: Radiologist  Significant Diagnostic Studies: CT of left hip,Xray of Left Femur and aCT Myelogram. Treatments: therapies: Physical Therapy  Discharge Exam: Blood pressure 103/61, pulse 60, temperature 96.8 F (36 C), temperature source Axillary, resp. rate 16, height 5\' 3"  (1.6 m), weight 70.308 kg (155 lb), SpO2 96.00%. Neurologic: Grossly normal  Disposition:   Discharge Orders    Future Orders Please Complete By Expires   Diet - low sodium heart healthy      Call MD / Call 911      Comments:   If you experience chest pain or shortness of breath, CALL 911 and be transported to the hospital emergency room.  If you develope a fever above 101 F, pus (white drainage) or increased drainage or redness at the wound, or calf pain, call your surgeon's office.   Constipation Prevention      Comments:   Drink plenty of fluids.  Prune juice may be helpful.  You may use a stool softener, such as Colace (over the counter) 100 mg twice a day.  Use MiraLax (over the counter) for constipation as needed.   Increase activity slowly as tolerated      Discharge instructions      Comments:   Walk with your walker   Weight bearing as tolerated      Lifting restrictions      Comments:   No lifting   Vital signs      Comments:   VS on return to floor.     Medication List  As of 07/21/2011  8:29 PM   STOP taking these medications        fludrocortisone 0.1 MG tablet         TAKE these medications         cephALEXin 500 MG capsule   Commonly known as: KEFLEX   Take 500 mg by mouth 4 (four) times daily.      donepezil 10 MG tablet   Commonly known as: ARICEPT   Take 10 mg by mouth at bedtime.      folic acid 1 MG tablet   Commonly known as: FOLVITE   Take 1 mg by mouth daily.      HYDROcodone-acetaminophen 5-325 MG per tablet   Commonly known as: NORCO   Take 1-2 tablets by mouth every 4 (four) hours as needed.      hydroxychloroquine 200 MG tablet   Commonly known as: PLAQUENIL   Take 200 mg by mouth 2 (two) times daily.      methotrexate 2.5 MG tablet   Commonly known as: RHEUMATREX   Take 25 mg by mouth once a week. Caution:Chemotherapy. Protect from light.      mirtazapine 30 MG tablet   Commonly known as: REMERON   Take 30 mg by mouth at bedtime.      naproxen 500 MG tablet   Commonly known as: NAPROSYN   Take 500 mg by mouth 2 (two) times daily with a meal.  omeprazole 20 MG capsule   Commonly known as: PRILOSEC   daily.           Follow-up Information    Follow up in 2 weeks.         Signed: Quade Ramirez A 07/21/2011, 8:29 PM

## 2011-07-22 NOTE — Progress Notes (Signed)
Subjective: Doing much better today. I can now move her Left leg  And she has no pain. Will need PT at the SNF.   Objective: Vital signs in last 24 hours: Temp:  [96.8 F (36 C)-98.5 F (36.9 C)] 98.4 F (36.9 C) (07/15 0520) Pulse Rate:  [60-67] 60  (07/15 0520) Resp:  [16-20] 16  (07/15 0520) BP: (103-188)/(61-75) 148/67 mmHg (07/15 0520) SpO2:  [91 %-96 %] 93 % (07/15 0520)  Intake/Output from previous day: 07/14 0701 - 07/15 0700 In: 720 [P.O.:720] Out: 1100 [Urine:1100] Intake/Output this shift:    No results found for this basename: HGB:5 in the last 72 hours No results found for this basename: WBC:2,RBC:2,HCT:2,PLT:2 in the last 72 hours No results found for this basename: NA:2,K:2,CL:2,CO2:2,BUN:2,CREATININE:2,GLUCOSE:2,CALCIUM:2 in the last 72 hours No results found for this basename: LABPT:2,INR:2 in the last 72 hours  Neurologically intact  Assessment/Plan: SNF Today. Will see in 2-weeks.   Abdulmalik Darco A 07/22/2011, 7:21 AM

## 2011-07-22 NOTE — Progress Notes (Signed)
Physical Therapy Treatment Patient Details Name: Rita [name] MRN: [id number] DOB: 12-02-1926 Today's Date: 07/22/2011 Time: 1478-2956 PT Time Calculation (min): 18 min  PT Assessment / Plan / Recommendation Comments on Treatment Session  Pt ambulated into hallway and reports "a little bit" of pain in L hip/groin area however unable to rate.  Pt to d/c today.    Follow Up Recommendations  Skilled nursing facility    Barriers to Discharge        Equipment Recommendations  Defer to next venue    Recommendations for Other Services    Frequency     Plan Discharge plan remains appropriate;Frequency remains appropriate    Precautions / Restrictions Precautions Precautions: Fall   Pertinent Vitals/Pain Pt reports "a little bit" of pain L hip/groin area with ambulation however reports it feels better upon resting in recliner.    Mobility  Bed Mobility Bed Mobility: Rolling Right;Right Sidelying to Sit Rolling Right: 4: Min assist Right Sidelying to Sit: 3: Mod assist;HOB flat;With rails Details for Bed Mobility Assistance: verbal cues for log roll technique, increased time Transfers Transfers: Stand to Sit;Sit to Stand Sit to Stand: 4: Min assist;From bed;From elevated surface Stand to Sit: 4: Min assist;With upper extremity assist;To chair/3-in-1 Details for Transfer Assistance: verbal cues for safe technique and forward lean Ambulation/Gait Ambulation/Gait Assistance: 4: Min assist Ambulation Distance (Feet): 25 Feet Assistive device: Rolling walker Ambulation/Gait Assistance Details: pt reports "a little bit" of pain in L hip area however states limited distance mostly due to UE fatigue, verbal cues for sequence and safe RW distance Gait Pattern: Step-to pattern;Decreased stride length Gait velocity: decreased    Exercises     PT Diagnosis:    PT Problem List:   PT Treatment Interventions:     PT Goals Acute Rehab PT Goals PT Goal: Supine/Side to Sit - Progress:  Progressing toward goal PT Goal: Sit to Stand - Progress: Partly met PT Goal: Stand to Sit - Progress: Partly met PT Goal: Ambulate - Progress: Progressing toward goal  Visit Information  Last PT Received On: 07/22/11 Assistance Needed: +1    Subjective Data  Subjective: "I haven't walked since I've been in here."   Cognition  Overall Cognitive Status: Impaired Area of Impairment: Memory Arousal/Alertness: Awake/alert Behavior During Session: WFL for tasks performed    Balance     End of Session PT - End of Session Activity Tolerance: Patient limited by fatigue Patient left: in chair;with call bell/phone within reach;with family/visitor present   GP     Rita [name],KATHrine E 07/22/2011, 12:26 PM Pager: [phone]

## 2011-07-22 NOTE — Progress Notes (Signed)
Patient cleared for discharge. Packet copied and placed in [place]. ptar called for transportation to [facility]. Patient and daughter at bedside informed and agreeable.  Markell Schrier C. Temitayo Covalt MSW, LCSW [phone]

## 2011-07-23 NOTE — Progress Notes (Signed)
Clinical Social Work Department CLINICAL SOCIAL WORK PLACEMENT NOTE 07/23/2011  Patient:  Rita [name], Rita [name]  Account Number:  [id number] Admit date:  07/17/2011  Clinical Social Worker:  Cori Razor, LCSW  Date/time:  07/19/2011 07:56 AM  Clinical Social Work is seeking post-discharge placement for this patient at the following level of care:   SKILLED NURSING   (*CSW will update this form in Epic as items are completed)   07/18/2011  Patient/family provided with [facility] Department of Clinical Social Work's list of facilities offering this level of care within the geographic area requested by the patient (or if unable, by the patient's family).  07/18/2011  Patient/family informed of their freedom to choose among providers that offer the needed level of care, that participate in Medicare, Medicaid or managed care program needed by the patient, have an available bed and are willing to accept the patient.    Patient/family informed of [facility]' ownership interest in [facility], as well as of the fact that they are under no obligation to receive care at this facility.  PASARR submitted to EDS on 07/18/2011 PASARR number received from EDS on 07/18/2011  FL2 transmitted to all facilities in geographic area requested by pt/family on  07/19/2011 FL2 transmitted to all facilities within larger geographic area on   Patient informed that his/her managed care company has contracts with or will negotiate with  certain facilities, including the following:     Patient/family informed of bed offers received:  07/19/2011 Patient chooses bed at [facility] Physician recommends and patient chooses bed at    Patient to be transferred to [facility] on  07/22/2011 Patient to be transferred to facility by [facility]  The following physician request were entered in Epic:   Additional Comments:  Cori Razor LCSW [phone]

## 2011-08-02 NOTE — H&P (Signed)
I agree with the H&P Note by [employer],

## 2011-09-03 ENCOUNTER — Encounter: Payer: Self-pay | Admitting: *Deleted

## 2011-09-10 ENCOUNTER — Encounter: Payer: Self-pay | Admitting: Internal Medicine

## 2011-09-10 ENCOUNTER — Ambulatory Visit (INDEPENDENT_AMBULATORY_CARE_PROVIDER_SITE_OTHER): Payer: Medicare Other | Admitting: Internal Medicine

## 2011-09-10 VITALS — BP 113/68 | HR 95 | Ht 63.0 in | Wt 165.0 lb

## 2011-09-10 DIAGNOSIS — R42 Dizziness and giddiness: Secondary | ICD-10-CM

## 2011-09-10 DIAGNOSIS — I442 Atrioventricular block, complete: Secondary | ICD-10-CM

## 2011-09-10 DIAGNOSIS — R55 Syncope and collapse: Secondary | ICD-10-CM

## 2011-09-10 DIAGNOSIS — Z950 Presence of cardiac pacemaker: Secondary | ICD-10-CM

## 2011-09-10 LAB — PACEMAKER DEVICE OBSERVATION
AL AMPLITUDE: 2.8 mv
AL IMPEDENCE PM: 472 Ohm
BATTERY VOLTAGE: 2.76 V
RV LEAD AMPLITUDE: 22.4 mv
RV LEAD IMPEDENCE PM: 939 Ohm
VENTRICULAR PACING PM: 0

## 2011-09-10 MED ORDER — MIDODRINE HCL 2.5 MG PO TABS: ORAL_TABLET | ORAL | Status: DC

## 2011-09-10 NOTE — Assessment & Plan Note (Signed)
We will try and use low-dose ProAmatine 2.5 mg at breakfast and lunch and see if that doesn't help with some of her symptoms. She was previously on Florinef

## 2011-09-10 NOTE — Progress Notes (Signed)
Patient Care Team: Lise Auer as PCP - General (Family Medicine)   HPI  Rita [name] is a 78 y.o. female seen in followup for syncope in the context of intermittent complete heart block which has been felt to be autonomic in origin. Intermittent CHB was identified on previously implanted ILR with device change in 2008  Prev LV function 2007 normal LVEF  Myoview 2009 noormal   She continues to have problems with dizziness with walking.        Past Medical History  Diagnosis Date  . Syncope   . Dysautonomia   . Hypothyroidism   . GERD (gastroesophageal reflux disease)   . Rheumatoid arthritis     on MTX since 2010    Past Surgical History  Procedure Date  . Pacemaker placement     AV (S/P)  . Cholecystectomy   . Other surgical history     LOOP recorder    Current Outpatient Prescriptions  Medication Sig Dispense Refill  . donepezil (ARICEPT) 10 MG tablet Take 10 mg by mouth at bedtime.      . folic acid (FOLVITE) 1 MG tablet Take 1 mg by mouth daily.        . hydroxychloroquine (PLAQUENIL) 200 MG tablet Take 200 mg by mouth 2 (two) times daily.      . methotrexate (RHEUMATREX) 2.5 MG tablet Take 25 mg by mouth once a week. Caution:Chemotherapy. Protect from light.      . mirtazapine (REMERON) 30 MG tablet Take 30 mg by mouth at bedtime.      . naproxen (NAPROSYN) 500 MG tablet Take 500 mg by mouth 2 (two) times daily with a meal.      . omeprazole (PRILOSEC) 20 MG capsule daily.         Allergies  Allergen Reactions  . Sulfonamide Derivatives Hives    Review of Systems negative except from HPI and PMH  Physical Exam BP 113/68  Pulse 95  Ht 5\' 3"  (1.6 m)  Wt 165 lb (74.844 kg)  BMI 29.23 kg/m2 Well developed and well nourished in no acute distress HENT normal E scleral and icterus clear Neck Supple Clear to ausculation Regular rate and rhythm, no murmurs gallops or rub Soft with active bowel sounds No clubbing cyanosis none Edema Alertand Ox1,  grossly normal motor and sensory function Skin Warm and Dry  Blood the cardiogram demonstrated sinus rhythm at 84 Intervals. 18/09/39 Otherwise normal  Assessment and  Plan

## 2011-09-10 NOTE — Assessment & Plan Note (Signed)
The patient's device was interrogated.  The information was reviewed. No changes were made in the programming.    

## 2011-09-10 NOTE — Assessment & Plan Note (Signed)
Stable post pacicng

## 2011-09-10 NOTE — Patient Instructions (Addendum)
Remote monitoring is used to monitor your Pacemaker of ICD from home. This monitoring reduces the number of office visits required to check your device to one time per year. It allows [name] to keep an eye on the functioning of your device to ensure it is working properly. You are scheduled for a device check from home on December 16, 2011. You may send your transmission at any time that day. If you have a wireless device, the transmission will be sent automatically. After your physician reviews your transmission, you will receive a postcard with your next transmission date.  Your physician wants you to follow-up in: 1 year with Dr Graciela Husbands.  You will receive a reminder letter in the mail two months in advance. If you don't receive a letter, please call our office to schedule the follow-up appointment.  Start Proamatine 2.5mg  1 tablet at breakfast and lunch daily.

## 2011-09-10 NOTE — Assessment & Plan Note (Signed)
No recurrent syncope 

## 2011-12-16 ENCOUNTER — Ambulatory Visit (INDEPENDENT_AMBULATORY_CARE_PROVIDER_SITE_OTHER): Payer: Medicare Other | Admitting: *Deleted

## 2011-12-16 DIAGNOSIS — Z950 Presence of cardiac pacemaker: Secondary | ICD-10-CM

## 2011-12-16 DIAGNOSIS — I442 Atrioventricular block, complete: Secondary | ICD-10-CM

## 2011-12-25 LAB — REMOTE PACEMAKER DEVICE
AL AMPLITUDE: 2.8 mv
AL IMPEDENCE PM: 465 Ohm
AL THRESHOLD: 0.75 V
ATRIAL PACING PM: 0
BATTERY VOLTAGE: 2.75 V
RV LEAD AMPLITUDE: 22.4 mv
RV LEAD IMPEDENCE PM: 824 Ohm
RV LEAD THRESHOLD: 0.5 V
VENTRICULAR PACING PM: 0

## 2012-01-09 ENCOUNTER — Encounter: Payer: Self-pay | Admitting: *Deleted

## 2012-01-16 ENCOUNTER — Encounter: Payer: Self-pay | Admitting: Internal Medicine

## 2012-03-23 ENCOUNTER — Encounter: Payer: Medicare Other | Admitting: *Deleted

## 2012-04-01 ENCOUNTER — Encounter: Payer: Self-pay | Admitting: *Deleted

## 2012-05-21 ENCOUNTER — Telehealth: Payer: Self-pay | Admitting: Internal Medicine

## 2012-05-21 ENCOUNTER — Ambulatory Visit (INDEPENDENT_AMBULATORY_CARE_PROVIDER_SITE_OTHER): Payer: Medicare Other | Admitting: *Deleted

## 2012-05-21 DIAGNOSIS — I442 Atrioventricular block, complete: Secondary | ICD-10-CM

## 2012-05-21 DIAGNOSIS — Z950 Presence of cardiac pacemaker: Secondary | ICD-10-CM

## 2012-05-21 NOTE — Telephone Encounter (Signed)
New Problem:    Patient's daughter called to state that they recently found her mother's transmission device and will be sending a transmission today.  Please call back if you have any questions.

## 2012-05-21 NOTE — Telephone Encounter (Signed)
Transmission received, caregiver aware.

## 2012-05-26 LAB — REMOTE PACEMAKER DEVICE
AL IMPEDENCE PM: 406 Ohm
AL THRESHOLD: 0.5 V
ATRIAL PACING PM: 0
RV LEAD AMPLITUDE: 22.4 mv
RV LEAD IMPEDENCE PM: 898 Ohm
RV LEAD THRESHOLD: 0.5 V

## 2012-06-16 ENCOUNTER — Encounter: Payer: Self-pay | Admitting: *Deleted

## 2012-06-23 ENCOUNTER — Encounter: Payer: Self-pay | Admitting: Internal Medicine

## 2012-08-05 ENCOUNTER — Encounter: Payer: Self-pay | Admitting: Internal Medicine

## 2012-09-10 ENCOUNTER — Encounter: Payer: Medicare Other | Admitting: Cardiology

## 2012-09-22 ENCOUNTER — Encounter: Payer: Self-pay | Admitting: Cardiology

## 2012-09-22 ENCOUNTER — Ambulatory Visit (INDEPENDENT_AMBULATORY_CARE_PROVIDER_SITE_OTHER): Payer: Medicare Other | Admitting: Cardiology

## 2012-09-22 VITALS — BP 132/82 | HR 64 | Ht 63.0 in | Wt 155.0 lb

## 2012-09-22 DIAGNOSIS — I442 Atrioventricular block, complete: Secondary | ICD-10-CM

## 2012-09-22 DIAGNOSIS — Z950 Presence of cardiac pacemaker: Secondary | ICD-10-CM

## 2012-09-22 LAB — PACEMAKER DEVICE OBSERVATION
AL AMPLITUDE: 4 mv
BATTERY VOLTAGE: 2.75 V
RV LEAD AMPLITUDE: 15.68 mv
RV LEAD IMPEDENCE PM: 917 Ohm
VENTRICULAR PACING PM: 0.6

## 2012-09-22 NOTE — Patient Instructions (Addendum)
Your physician wants you to follow-up in: 1 year with DR. Graciela Husbands You will receive a reminder letter in the mail two months in advance. If you don't receive a letter, please call our office to schedule the follow-up appointment.  Your physician recommends that you schedule a follow-up appointment in: 3 MONTHS REMOTE CHECK  Your physician recommends that you continue on your current medications as directed. Please refer to the Current Medication list given to you today.

## 2012-09-22 NOTE — Progress Notes (Signed)
ELECTROPHYSIOLOGY OFFICE NOTE  Patient ID: Rita [name] MRN: [id number], DOB/AGE: 02-13-1926   Date of Visit: 09/22/2012  Primary Physician: Lise Auer, MD Primary Cardiologist: Berton Mount, MD Reason for Visit: EP/device follow-up  History of Present Illness  Rita [name] is a 79 y.o. female with transient CHB s/p PPM implant who presents today for routine electrophysiology followup. She is accompanied by her daughter. Since last being seen in our clinic, she reports she is doing well and has no new cardiac complaints. She continues to have intermittent, brief dizziness but denies near syncope or syncope. At her last visit Dr. Graciela Husbands prescribed ProAmatine; however, she was not able to afford this medication. She took it for one month and is not sure she saw much improvement so stopped. She denies chest pain or shortness of breath. She denies palpitations, dizziness, near syncope or syncope. She denies LE swelling, orthopnea, PND or recent weight gain.   Past Medical History Past Medical History  Diagnosis Date  . Syncope   . Dysautonomia   . Hypothyroidism   . GERD (gastroesophageal reflux disease)   . Rheumatoid arthritis(714.0)     on MTX since 2010    Past Surgical History Past Surgical History  Procedure Laterality Date  . Pacemaker placement      AV (S/P)  . Cholecystectomy    . Other surgical history      LOOP recorder    Allergies/Intolerances Allergies  Allergen Reactions  . Sulfonamide Derivatives Hives    Current Home Medications Current Outpatient Prescriptions  Medication Sig Dispense Refill  . donepezil (ARICEPT) 10 MG tablet Take 10 mg by mouth at bedtime.      . folic acid (FOLVITE) 1 MG tablet Take 1 mg by mouth daily.        . hydroxychloroquine (PLAQUENIL) 200 MG tablet Take 200 mg by mouth 2 (two) times daily.      Marland Kitchen levothyroxine (SYNTHROID, LEVOTHROID) 50 MCG tablet Take 50 mcg by mouth daily before breakfast.       . methotrexate (RHEUMATREX)  2.5 MG tablet Take 25 mg by mouth once a week. Caution:Chemotherapy. Protect from light.      . midodrine (PROAMATINE) 2.5 MG tablet Take 1 tablet at breakfast and lunch  60 tablet  3  . mirtazapine (REMERON) 30 MG tablet Take 30 mg by mouth at bedtime.      Marland Kitchen omeprazole (PRILOSEC) 20 MG capsule daily.        No current facility-administered medications for this visit.    Social History History   Social History  . Marital Status: Widowed    Spouse Name: N/A    Number of Children: 3  . Years of Education: N/A   Occupational History  . Not on file.   Social History Main Topics  . Smoking status: Former Smoker -- 0.50 packs/day for 1 years    Types: Cigarettes  . Smokeless tobacco: Never Used  . Alcohol Use: Not on file  . Drug Use: Not on file  . Sexual Activity: Not on file   Other Topics Concern  . Not on file   Social History Narrative   Has lived in [place], [place], [place]   Patient states former smoker   No known TB exposures     Review of Systems General: No chills, fever, night sweats or weight changes Cardiovascular: No chest pain, dyspnea on exertion, edema, orthopnea, palpitations, paroxysmal nocturnal dyspnea Dermatological: No rash, lesions or masses Respiratory: No cough, dyspnea Urologic: No hematuria,  dysuria Abdominal: No nausea, vomiting, diarrhea, bright red blood per rectum, melena, or hematemesis Neurologic: No visual changes, weakness, changes in mental status All other systems reviewed and are otherwise negative except as noted above.  Physical Exam Vitals: Blood pressure 132/82, pulse 64, height 5\' 3"  (1.6 m), weight 155 lb (70.308 kg).  General: Well developed, well appearing 79 y.o. female in no acute distress. HEENT: Normocephalic, atraumatic. EOMs intact. Sclera nonicteric. Oropharynx clear.  Neck: Supple without bruits. No JVD. Lungs: Respirations regular and unlabored, CTA bilaterally. No wheezes, rales or rhonchi. Heart: RRR. S1, S2 present. No  murmurs, rub, S3 or S4. Abdomen: Soft, non-distended.  Extremities: No clubbing, cyanosis or edema. PT/Radials 2+ and equal bilaterally. Psych: Normal affect. Neuro: Alert and oriented X 3. Moves all extremities spontaneously.   Diagnostics Device interrogation - Normal device function. Thresholds, sensing, impedances consistent with previous measurements. Device programmed to maximize longevity. No mode switch or high ventricular rates noted. Device programmed at appropriate safety margins. Histogram distribution appropriate for patient activity level. Device programmed to optimize intrinsic conduction. Estimated longevity 3.5 years.  Assessment and Plan 1. Transient CHB s/p PPM implant Normal device function No programming changes made Continue routine remote PPM checks every 3 months Return to clinic for follow-up with Dr. Graciela Husbands in one year  Signed, Rick Duff, PA-C 09/22/2012, 9:27 AM

## 2012-10-22 ENCOUNTER — Encounter: Payer: Self-pay | Admitting: Internal Medicine

## 2012-10-26 IMAGING — CT CT L SPINE W/O CM
3 of 4 series · 14 of 33 positions shown, 17 images · IV contrast (omnipaque)
Comparison: CT of the left hip was unremarkable.

MYELOGRAM INJECTION
TECHNIQUE: Informed consent was obtained from the patient prior to
the procedure, including potential complications of headache,
allergy, infection and pain. Specific instructions were given
regarding 24 hour bedrest post procedure to prevent post-LP
headache.  A timeout procedure was performed.  With the patient
prone, the lower back was prepped with Betadine.  1% Lidocaine was
used for local anesthesia.  Lumbar puncture was performed by the
radiologist at the L3-L4 level using a 22 gauge needle with return
of clear CSF.  15 cc of Omnipaque 180 was injected by the
radiologist into the subarachnoid space.  Dr. Lmahi personally
supervised acquisition of the myelogram images.
CLINICAL DATA: Back pain. Left leg pain.
TECHNIQUE: Multidetector CT imaging of the lumbar spine was
performed following myelography.  Multiplanar CT image
reconstructions were also generated.

[Series 602: <mpr thick range> · axial · 0.40mm/px · z∈[-157,-21]mm · 6 of 96 slices shown, 8 images]
[im 14/96  soft-tissue]
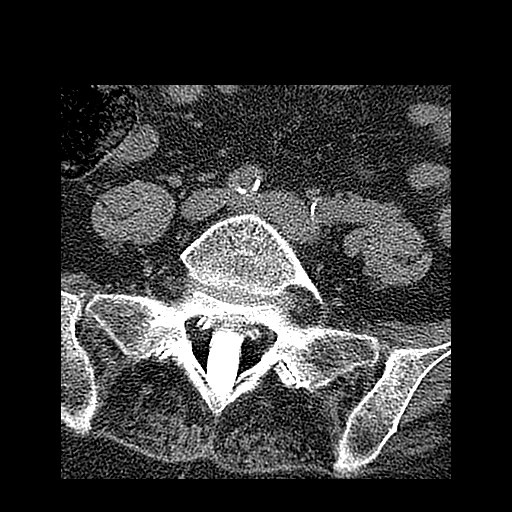
[im 14/96  bone]
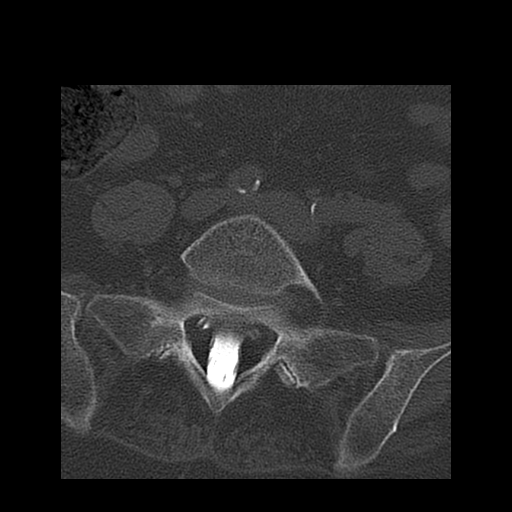
[im 28/96  bone]
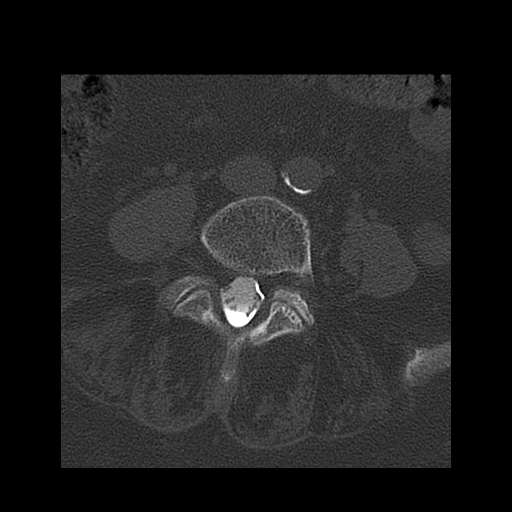
[im 41/96  bone]
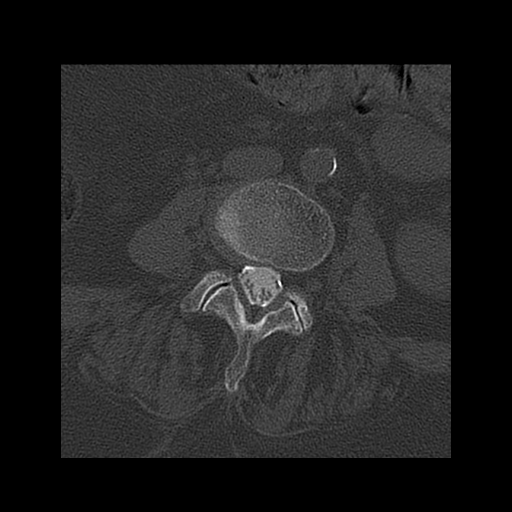
[im 55/96  bone]
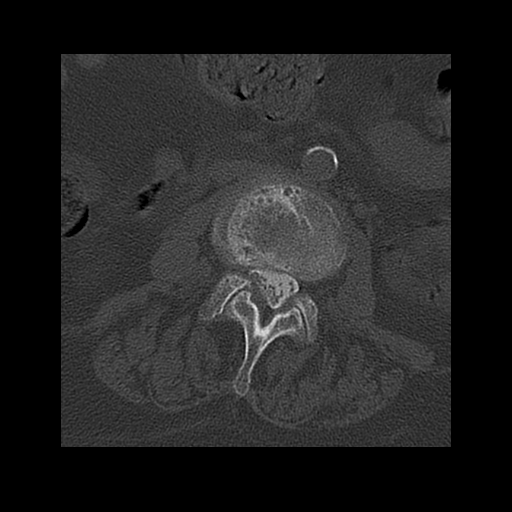
[im 68/96  soft-tissue]
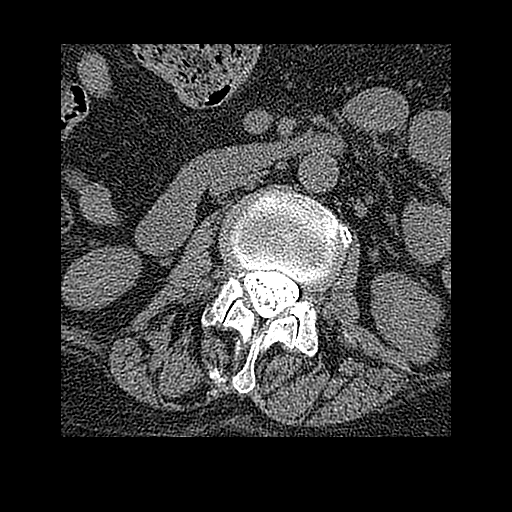
[im 68/96  bone]
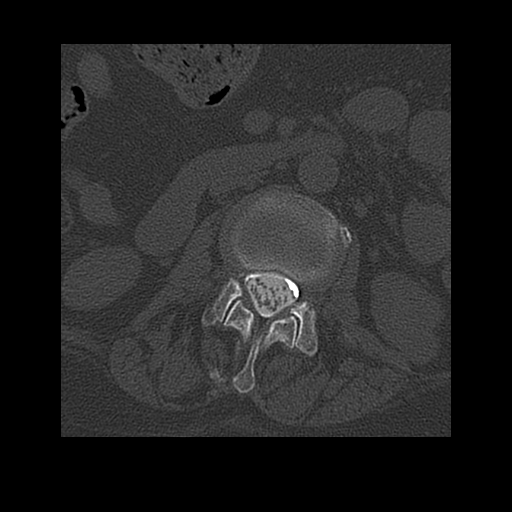
[im 82/96  bone]
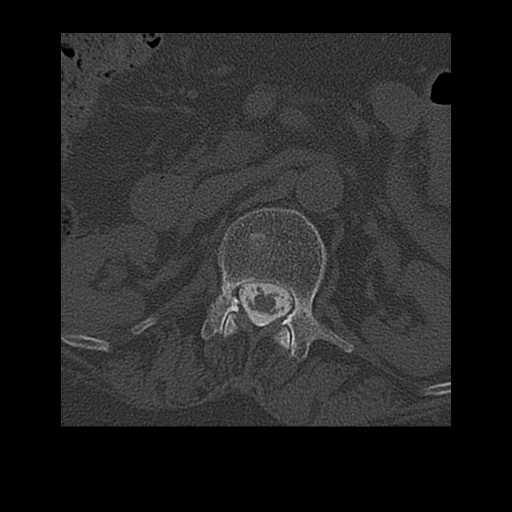

[Series 603: <mpr thick range(1)> · coronal · 0.40mm/px · 3 of 67 slices shown]
[im 14/67  bone]
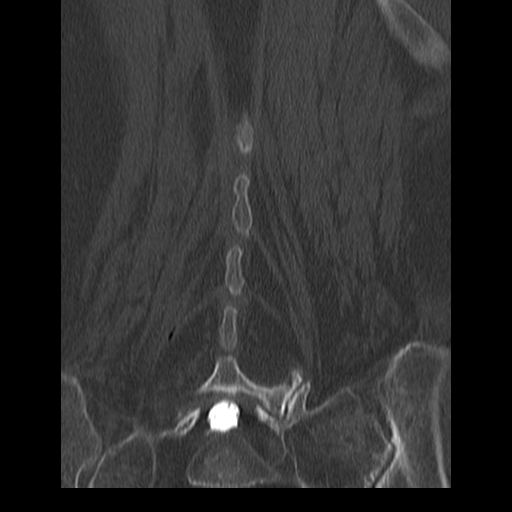
[im 27/67  bone]
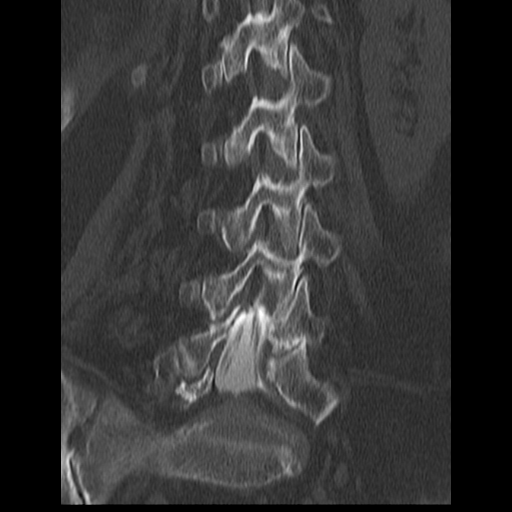
[im 40/67  bone]
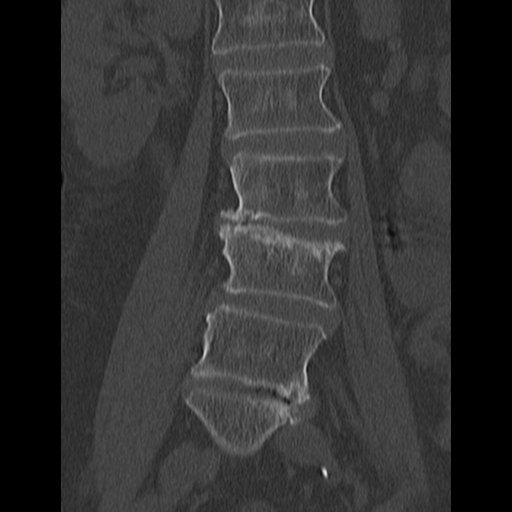

[Series 605: <mpr thick range(3)> · sagittal · 0.40mm/px · 5 of 48 slices shown, 6 images]
[im 16/48  bone]
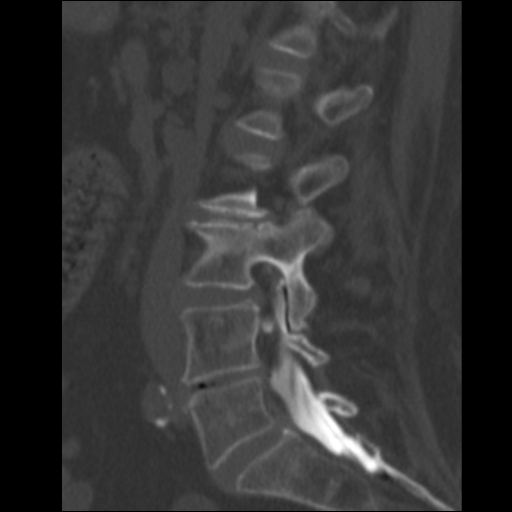
[im 20/48  bone]
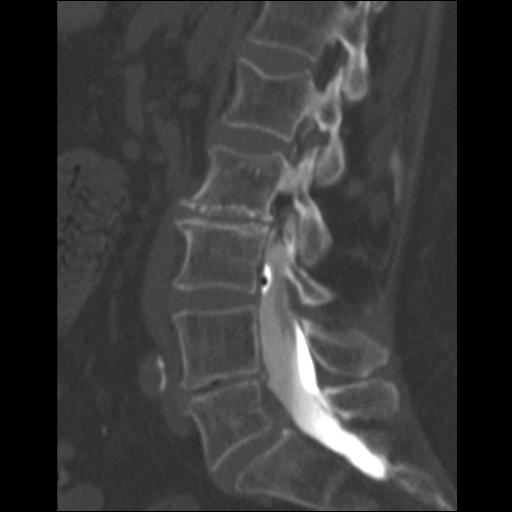
[im 24/48  soft-tissue]
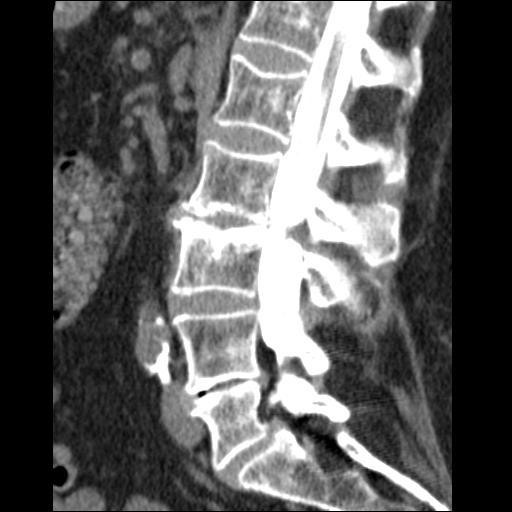
[im 24/48  bone]
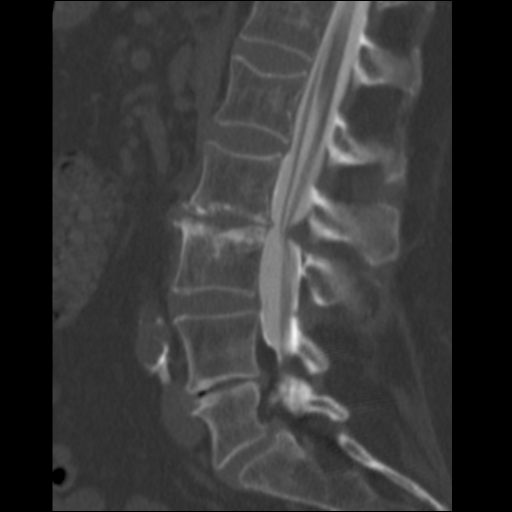
[im 28/48  bone]
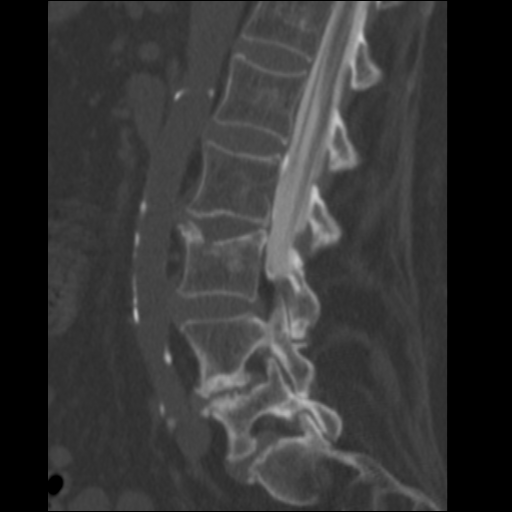
[im 32/48  bone]
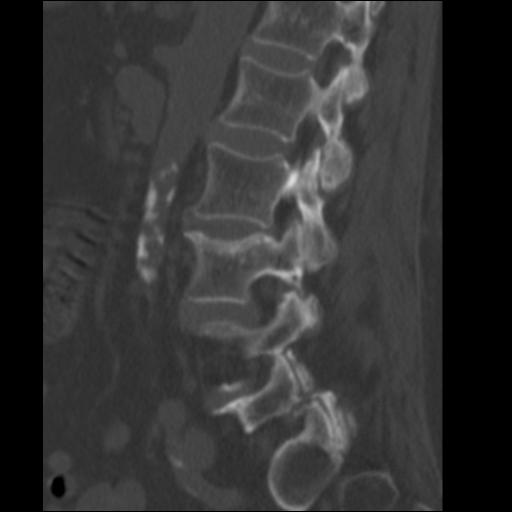

[14 of 33 positions shown; findings below may reference images not displayed]

IMPRESSION: Successful injection of  intrathecal contrast for myelography.

MYELOGRAM LUMBAR
FINDINGS: There is a slight mixed injection with the predominance
of contrast in a subarachnoid location. There is moderately
advanced disc space narrowing at L2-3, worse on the right, and
moderately advanced disc space narrowing at L4-5, worse on the
left.  Proliferative osteophytic spurring is noted on the right and
left respectively at these levels.  Slight degenerative scoliosis
convex left mid lumbar region relates to L2-3 narrowing.

Anatomic alignment is present except for approximately 4-5 mm
anterolisthesis L5 on S1. Stenosis is present at the L2-L3 level
with right L3 nerve root encroachment.  There is disc space
narrowing at L4-5, worse on the left, and L5-S1.

Fluoroscopy Time: 6.1 minutes
IMPRESSION: As above.

CT MYELOGRAPHY LUMBAR SPINE
FINDINGS: Nonaneurysmal atherosclerotic change of the aorta.  No
paravertebral masses.  No osseous destructive lesions.

L1-2: Conus slightly low ending at L1-L2.  No disc protrusion,
spinal stenosis, or nerve root encroachment.

L2-3: Advanced disc space narrowing.  Asymmetric posterior element
hypertrophy affects the right greater than left facets/ligamentum
flava.  Asymmetric loss of interspace height is also noted on the
right with central and rightward spurring and mild annular bulging.
Right L2 and right L3 nerve root encroachment.  Superior L3
endplate deformity likely represents a Schmorl's node.

L3-4: Mild bulge.  Mild facet arthropathy.  No neural compression.

L4-5: Asymmetric loss of interspace height on the left is
accompanied by mild left-sided facet arthropathy, calcified central
protrusion, and left-sided foraminal narrowing. Extraforaminal
spurring is accompanied by a far lateral protrusion, seen best on
coronal images 31-35. There is no left L5 nerve root encroachment
in the canal.  Left L4 nerve root encroachment is likely (image 30,
series 604).

L5-S1: Definite left, and probable right, L5 pars interarticularis
defects.  Superimposed facet arthropathy.  4 mm anterolisthesis L5
on S1.  Central disc protrusion without S1 nerve root compression.
Bilateral neural foraminal narrowing appears to affect the left L5
nerve root but not the right.
IMPRESSION: Asymmetric loss of interspace height at L4-5 on the left associated
with a far lateral protrusion and osteophytic spurring on the left
likely compressing the left L4 nerve root in the foramen and/or
extraforaminal soft tissues.

Pars interarticularis defects at L5 associated with 4 mm of slip
and left-sided neural foraminal narrowing.  Left L5 nerve root
encroachment likely.

Asymmetric disc space narrowing L2-3 on the right with associated
disc material and osteophytic spurring compress  the right L2 and
L3 nerve roots.

## 2012-12-28 ENCOUNTER — Ambulatory Visit (INDEPENDENT_AMBULATORY_CARE_PROVIDER_SITE_OTHER): Payer: Medicare Other | Admitting: *Deleted

## 2012-12-28 DIAGNOSIS — I442 Atrioventricular block, complete: Secondary | ICD-10-CM

## 2013-01-08 LAB — MDC_IDC_ENUM_SESS_TYPE_REMOTE
Battery Impedance: 1244 Ohm
Battery Remaining Longevity: 42 mo
Battery Voltage: 2.74 V
Brady Statistic AP VP Percent: 0 %
Brady Statistic AP VS Percent: 0 %
Brady Statistic AS VP Percent: 0 %
Brady Statistic AS VS Percent: 99 %
Date Time Interrogation Session: 20141222195533
Lead Channel Impedance Value: 453 Ohm
Lead Channel Impedance Value: 986 Ohm
Lead Channel Pacing Threshold Amplitude: 0.625 V
Lead Channel Pacing Threshold Amplitude: 0.625 V
Lead Channel Pacing Threshold Pulse Width: 0.4 ms
Lead Channel Pacing Threshold Pulse Width: 0.4 ms
Lead Channel Sensing Intrinsic Amplitude: 2.8 mV
Lead Channel Sensing Intrinsic Amplitude: 22.4 mV
Lead Channel Setting Pacing Amplitude: 2 V
Lead Channel Setting Pacing Amplitude: 2.5 V
Lead Channel Setting Pacing Pulse Width: 0.4 ms
Lead Channel Setting Sensing Sensitivity: 4 mV

## 2013-01-21 ENCOUNTER — Encounter: Payer: Self-pay | Admitting: *Deleted

## 2013-01-29 ENCOUNTER — Encounter: Payer: Self-pay | Admitting: Internal Medicine

## 2013-03-31 ENCOUNTER — Ambulatory Visit (INDEPENDENT_AMBULATORY_CARE_PROVIDER_SITE_OTHER): Payer: Medicare Other | Admitting: *Deleted

## 2013-03-31 DIAGNOSIS — I442 Atrioventricular block, complete: Secondary | ICD-10-CM

## 2013-04-01 LAB — MDC_IDC_ENUM_SESS_TYPE_REMOTE
Battery Impedance: 1382 Ohm
Brady Statistic AP VS Percent: 0.2 %
Brady Statistic AS VS Percent: 99.2 %
Lead Channel Impedance Value: 1048 Ohm
Lead Channel Pacing Threshold Amplitude: 0.5 V
Lead Channel Pacing Threshold Amplitude: 0.75 V
Lead Channel Pacing Threshold Pulse Width: 0.4 ms
Lead Channel Sensing Intrinsic Amplitude: 8 mV
Lead Channel Setting Pacing Amplitude: 2.5 V
MDC IDC MSMT BATTERY VOLTAGE: 2.74 V
MDC IDC MSMT LEADCHNL RA IMPEDANCE VALUE: 478 Ohm
MDC IDC MSMT LEADCHNL RA SENSING INTR AMPL: 2.8 mV
MDC IDC MSMT LEADCHNL RV PACING THRESHOLD PULSEWIDTH: 0.4 ms
MDC IDC SET LEADCHNL RA PACING AMPLITUDE: 2 V
MDC IDC SET LEADCHNL RV PACING PULSEWIDTH: 0.4 ms
MDC IDC SET LEADCHNL RV SENSING SENSITIVITY: 4 mV
MDC IDC STAT BRADY AP VP PERCENT: 0.3 %
MDC IDC STAT BRADY AS VP PERCENT: 0.3 %

## 2013-05-06 ENCOUNTER — Encounter: Payer: Self-pay | Admitting: *Deleted

## 2013-05-21 ENCOUNTER — Encounter: Payer: Self-pay | Admitting: Internal Medicine

## 2013-07-05 ENCOUNTER — Ambulatory Visit (INDEPENDENT_AMBULATORY_CARE_PROVIDER_SITE_OTHER): Payer: Medicare Other | Admitting: *Deleted

## 2013-07-05 ENCOUNTER — Telehealth: Payer: Self-pay | Admitting: Cardiology

## 2013-07-05 DIAGNOSIS — R55 Syncope and collapse: Secondary | ICD-10-CM

## 2013-07-05 DIAGNOSIS — I442 Atrioventricular block, complete: Secondary | ICD-10-CM

## 2013-07-05 LAB — MDC_IDC_ENUM_SESS_TYPE_REMOTE
Battery Impedance: 1469 Ohm
Battery Voltage: 2.74 V
Date Time Interrogation Session: 20150629150904
Lead Channel Impedance Value: 434 Ohm
Lead Channel Pacing Threshold Pulse Width: 0.4 ms
Lead Channel Pacing Threshold Pulse Width: 0.4 ms
Lead Channel Setting Pacing Amplitude: 2.5 V
Lead Channel Setting Sensing Sensitivity: 4 mV
MDC IDC MSMT BATTERY REMAINING LONGEVITY: 36 mo
MDC IDC MSMT LEADCHNL RA PACING THRESHOLD AMPLITUDE: 0.875 V
MDC IDC MSMT LEADCHNL RA SENSING INTR AMPL: 1.4 mV
MDC IDC MSMT LEADCHNL RV IMPEDANCE VALUE: 911 Ohm
MDC IDC MSMT LEADCHNL RV PACING THRESHOLD AMPLITUDE: 0.625 V
MDC IDC MSMT LEADCHNL RV SENSING INTR AMPL: 8 mV
MDC IDC SET LEADCHNL RA PACING AMPLITUDE: 2 V
MDC IDC SET LEADCHNL RV PACING PULSEWIDTH: 0.4 ms
MDC IDC STAT BRADY AP VP PERCENT: 0 %
MDC IDC STAT BRADY AP VS PERCENT: 0 %
MDC IDC STAT BRADY AS VP PERCENT: 0 %
MDC IDC STAT BRADY AS VS PERCENT: 99 %

## 2013-07-05 NOTE — Progress Notes (Signed)
Remote pacemaker transmission.   

## 2013-07-05 NOTE — Telephone Encounter (Signed)
LMOVM reminding pt to send remote transmission.   

## 2013-07-21 ENCOUNTER — Encounter: Payer: Self-pay | Admitting: Cardiology

## 2013-07-27 ENCOUNTER — Encounter: Payer: Self-pay | Admitting: Internal Medicine

## 2013-10-15 ENCOUNTER — Encounter: Payer: Self-pay | Admitting: *Deleted

## 2013-12-09 ENCOUNTER — Encounter: Payer: Self-pay | Admitting: Internal Medicine

## 2013-12-09 ENCOUNTER — Ambulatory Visit (INDEPENDENT_AMBULATORY_CARE_PROVIDER_SITE_OTHER): Payer: Medicare Other | Admitting: Internal Medicine

## 2013-12-09 VITALS — BP 130/74 | HR 88 | Ht 63.0 in | Wt 140.0 lb

## 2013-12-09 DIAGNOSIS — R55 Syncope and collapse: Secondary | ICD-10-CM | POA: Diagnosis not present

## 2013-12-09 DIAGNOSIS — Z45018 Encounter for adjustment and management of other part of cardiac pacemaker: Secondary | ICD-10-CM

## 2013-12-09 DIAGNOSIS — G909 Disorder of the autonomic nervous system, unspecified: Secondary | ICD-10-CM

## 2013-12-09 DIAGNOSIS — I442 Atrioventricular block, complete: Secondary | ICD-10-CM

## 2013-12-09 DIAGNOSIS — R42 Dizziness and giddiness: Secondary | ICD-10-CM

## 2013-12-09 DIAGNOSIS — G901 Familial dysautonomia [Riley-Day]: Secondary | ICD-10-CM

## 2013-12-09 LAB — MDC_IDC_ENUM_SESS_TYPE_INCLINIC
Brady Statistic AP VS Percent: 0 %
Brady Statistic AS VS Percent: 99 %
Date Time Interrogation Session: 20151203131937
Lead Channel Impedance Value: 969 Ohm
Lead Channel Pacing Threshold Amplitude: 0.5 V
Lead Channel Pacing Threshold Pulse Width: 0.4 ms
Lead Channel Pacing Threshold Pulse Width: 0.4 ms
Lead Channel Setting Sensing Sensitivity: 4 mV
MDC IDC MSMT BATTERY IMPEDANCE: 1694 Ohm
MDC IDC MSMT BATTERY REMAINING LONGEVITY: 32 mo
MDC IDC MSMT BATTERY VOLTAGE: 2.73 V
MDC IDC MSMT LEADCHNL RA IMPEDANCE VALUE: 473 Ohm
MDC IDC MSMT LEADCHNL RA PACING THRESHOLD AMPLITUDE: 0.75 V
MDC IDC MSMT LEADCHNL RA SENSING INTR AMPL: 4 mV
MDC IDC MSMT LEADCHNL RV SENSING INTR AMPL: 11.2 mV
MDC IDC SET LEADCHNL RA PACING AMPLITUDE: 2 V
MDC IDC SET LEADCHNL RV PACING AMPLITUDE: 2.5 V
MDC IDC SET LEADCHNL RV PACING PULSEWIDTH: 0.4 ms
MDC IDC STAT BRADY AP VP PERCENT: 0 %
MDC IDC STAT BRADY AS VP PERCENT: 0 %

## 2013-12-09 NOTE — Progress Notes (Signed)
      Patient Care Team: Lise AuerJaber A Khan, MD as PCP - General (Family Medicine)   HPI  Rita [name] is a 80 y.o. female seen in followup for syncope in the context of intermittent complete heart block which has been felt to be autonomic in origin. Intermittent CHB was identified on previously implanted ILR with device change in 2008  Prev LV function 2007 normal LVEF  Myoview 2009 noormal   We attempted to use ProAmatine in the past to treat her orthostatic hypotension. But she did not do this because of cost. One month that she did  Take it  she saw in significant improvement.   She is stable now in nusring home  Past Medical History  Diagnosis Date  . Syncope   . Dysautonomia   . Hypothyroidism   . GERD (gastroesophageal reflux disease)   . Rheumatoid arthritis(714.0)     on MTX since 2010    Past Surgical History  Procedure Laterality Date  . Pacemaker placement      AV (S/P)  . Cholecystectomy    . Other surgical history      LOOP recorder    Current Outpatient Prescriptions  Medication Sig Dispense Refill  . donepezil (ARICEPT) 10 MG tablet Take 10 mg by mouth at bedtime.    . folic acid (FOLVITE) 1 MG tablet Take 1 mg by mouth daily.      . hydroxychloroquine (PLAQUENIL) 200 MG tablet Take 200 mg by mouth 2 (two) times daily.    Marland Kitchen. levothyroxine (SYNTHROID, LEVOTHROID) 50 MCG tablet Take 50 mcg by mouth daily before breakfast.     . methotrexate (RHEUMATREX) 2.5 MG tablet Take 25 mg by mouth once a week. Caution:Chemotherapy. Protect from light.    . midodrine (PROAMATINE) 2.5 MG tablet Take 1 tablet at breakfast and lunch 60 tablet 3  . mirtazapine (REMERON) 30 MG tablet Take 30 mg by mouth at bedtime.    Marland Kitchen. omeprazole (PRILOSEC) 20 MG capsule daily.      No current facility-administered medications for this visit.    Allergies  Allergen Reactions  . Sulfonamide Derivatives Hives    Review of Systems negative except from HPI and PMH  Physical Exam BP  130/74 mmHg  Pulse 88 Well developed and well nourished in no acute distress HENT normal E scleral and icterus clear Neck Supple JVP flat; carotids brisk and full Clear to ausculation .dp RRR  ds No clubbing cyanosis  Edema Alert and oriented, grossly normal motor and sensory function Skin Warm and Dry  ECG NSR 88  Assessment and  Plan  Intermittent heart block  Pacer medtronic  The patient's device was interrogated.  The information was reviewed. No changes were made in the programming.      i have called her PCP in [place] and he will arrange the transfer of her care to cardiology there

## 2013-12-09 NOTE — Patient Instructions (Signed)
Your physician recommends that you continue on your current medications as directed. Please refer to the Current Medication list given to you today.  Remote monitoring is used to monitor your Pacemaker of ICD from home. This monitoring reduces the number of office visits required to check your device to one time per year. It allows us to keep an eye on the functioning of your device to ensure it is working properly. You are scheduled for a device check from home on 03/10/14. You may send your transmission at any time that day. If you have a wireless device, the transmission will be sent automatically. After your physician reviews your transmission, you will receive a postcard with your next transmission date.  Your physician wants you to follow-up in: 1 year with Dr. Graciela HusbandsKlein.  You will receive a reminder letter in the mail two months in advance. If you don't receive a letter, please call our office to schedule the follow-up appointment.

## 2014-03-10 ENCOUNTER — Ambulatory Visit (INDEPENDENT_AMBULATORY_CARE_PROVIDER_SITE_OTHER): Payer: Medicare Other | Admitting: *Deleted

## 2014-03-10 ENCOUNTER — Telehealth: Payer: Self-pay | Admitting: Cardiology

## 2014-03-10 DIAGNOSIS — I442 Atrioventricular block, complete: Secondary | ICD-10-CM

## 2014-03-10 NOTE — Telephone Encounter (Signed)
LMOVM reminding pt to send remote transmission.   

## 2014-03-11 ENCOUNTER — Telehealth: Payer: Self-pay | Admitting: Internal Medicine

## 2014-03-11 ENCOUNTER — Encounter: Payer: Self-pay | Admitting: Cardiology

## 2014-03-11 NOTE — Telephone Encounter (Signed)
LMOVM for pt to return call 

## 2014-03-11 NOTE — Telephone Encounter (Signed)
New message      Did you get patients remote transmission?  You called after they had already sent it

## 2014-03-14 ENCOUNTER — Telehealth: Payer: Self-pay | Admitting: Cardiology

## 2014-03-14 NOTE — Telephone Encounter (Signed)
Spoke w/ pt daughter. She wants to have pt care transferred back to Dr. Graciela HusbandsKlein. Informed pt daughter that I would call [facility] and have pt released back to our clinic. I informed pt daughter that I would contact her and let her know if the transmission was received from 03-10-14. She verbalized understanding.

## 2014-03-14 NOTE — Progress Notes (Signed)
Remote pacemaker transmission.   

## 2014-03-14 NOTE — Telephone Encounter (Signed)
LMOVM informing pt daughter that pt was transferred back to our clinic and that we will send a letter w/ results and next transmission date.

## 2014-03-16 LAB — MDC_IDC_ENUM_SESS_TYPE_REMOTE
Battery Remaining Longevity: 26 mo
Battery Voltage: 2.72 V
Brady Statistic AP VS Percent: 0 %
Brady Statistic AS VP Percent: 0 %
Lead Channel Impedance Value: 462 Ohm
Lead Channel Impedance Value: 810 Ohm
Lead Channel Pacing Threshold Amplitude: 0.5 V
Lead Channel Pacing Threshold Amplitude: 0.75 V
MDC IDC MSMT BATTERY IMPEDANCE: 2004 Ohm
MDC IDC MSMT LEADCHNL RA PACING THRESHOLD PULSEWIDTH: 0.4 ms
MDC IDC MSMT LEADCHNL RA SENSING INTR AMPL: 0.7 mV
MDC IDC MSMT LEADCHNL RV PACING THRESHOLD PULSEWIDTH: 0.4 ms
MDC IDC MSMT LEADCHNL RV SENSING INTR AMPL: 8 mV
MDC IDC SESS DTM: 20160303160954
MDC IDC SET LEADCHNL RA PACING AMPLITUDE: 2 V
MDC IDC SET LEADCHNL RV PACING AMPLITUDE: 2.5 V
MDC IDC SET LEADCHNL RV PACING PULSEWIDTH: 0.4 ms
MDC IDC SET LEADCHNL RV SENSING SENSITIVITY: 4 mV
MDC IDC STAT BRADY AP VP PERCENT: 0 %
MDC IDC STAT BRADY AS VS PERCENT: 100 %

## 2014-03-21 ENCOUNTER — Encounter: Payer: Self-pay | Admitting: Cardiology

## 2014-03-28 ENCOUNTER — Encounter: Payer: Self-pay | Admitting: Internal Medicine

## 2014-05-08 DEATH — deceased
